# Patient Record
Sex: Male | Born: 1955 | Race: White | Hispanic: No | Smoking: Never smoker
Health system: Southern US, Community
[De-identification: ages and names within clinical notes are randomized; demographics above are authoritative.]

## PROBLEM LIST (undated history)

## (undated) DIAGNOSIS — R972 Elevated prostate specific antigen [PSA]: Secondary | ICD-10-CM

## (undated) DIAGNOSIS — Z87442 Personal history of urinary calculi: Secondary | ICD-10-CM

## (undated) DIAGNOSIS — I10 Essential (primary) hypertension: Secondary | ICD-10-CM

## (undated) DIAGNOSIS — R7303 Prediabetes: Secondary | ICD-10-CM

## (undated) DIAGNOSIS — N179 Acute kidney failure, unspecified: Secondary | ICD-10-CM

## (undated) HISTORY — DX: Elevated prostate specific antigen (PSA): R97.20

## (undated) HISTORY — DX: Prediabetes: R73.03

## (undated) HISTORY — DX: Acute kidney failure, unspecified: N17.9

## (undated) HISTORY — DX: Essential (primary) hypertension: I10

---

## 1997-01-25 DIAGNOSIS — Z9889 Other specified postprocedural states: Secondary | ICD-10-CM

## 1997-01-25 HISTORY — DX: Other specified postprocedural states: Z98.890

## 1999-05-25 ENCOUNTER — Encounter: Payer: Self-pay | Admitting: Emergency Medicine

## 1999-05-25 ENCOUNTER — Emergency Department (HOSPITAL_COMMUNITY): Admission: EM | Admit: 1999-05-25 | Discharge: 1999-05-25 | Payer: Self-pay | Admitting: Emergency Medicine

## 1999-07-03 ENCOUNTER — Encounter: Admission: RE | Admit: 1999-07-03 | Discharge: 1999-07-03 | Payer: Self-pay | Admitting: Urology

## 1999-07-03 ENCOUNTER — Encounter: Payer: Self-pay | Admitting: Urology

## 2005-04-05 ENCOUNTER — Ambulatory Visit (HOSPITAL_COMMUNITY): Admission: RE | Admit: 2005-04-05 | Discharge: 2005-04-05 | Payer: Self-pay | Admitting: Urology

## 2007-02-12 IMAGING — CT CT ABDOMEN W/ CM
2 of 6 series · 16 of 46 positions shown, 18 images · IV contrast (omnipaque)
Comparison: none

CLINICAL DATA: Bladder cancer; evaluate for metastatic disease.  
 ABDOMEN CT WITH CONTRAST:
TECHNIQUE: Multidetector CT imaging of the abdomen was performed following the standard protocol during bolus administration of intravenous contrast.
 Contrast:  125 cc Omnipaque 300.
TECHNIQUE: Multidetector CT imaging of the pelvis was performed following the standard protocol during bolus administration of intravenous contrast.

[Series 2: abd_pel 5.0 b40f st · axial · 0.71mm/px · z∈[-476,-46]mm · 13 of 98 slices shown, 15 images]
[im 6/98  soft-tissue]
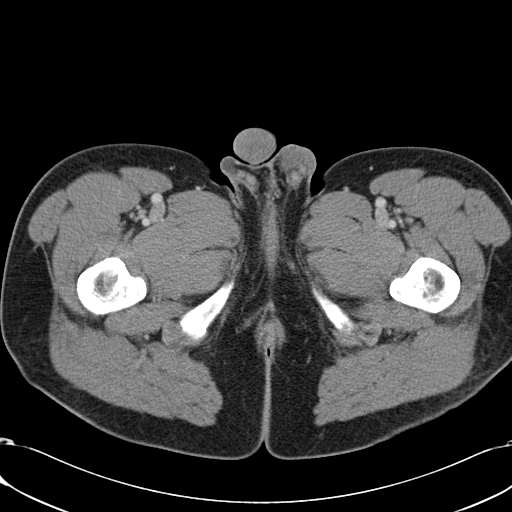
[im 6/98  bone]
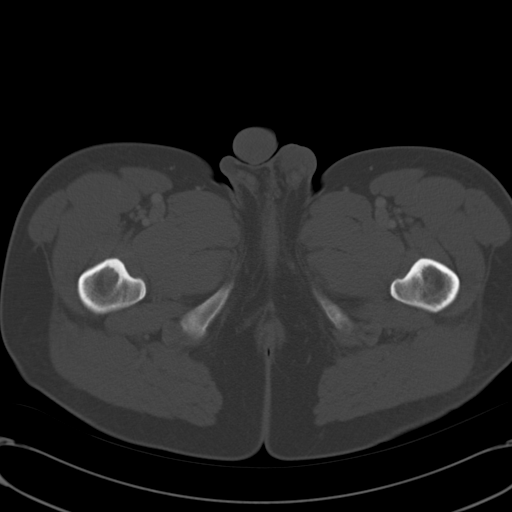
[im 12/98  soft-tissue]
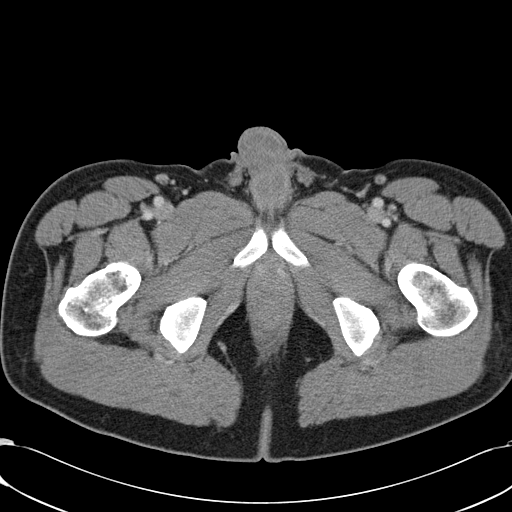
[im 23/98  soft-tissue]
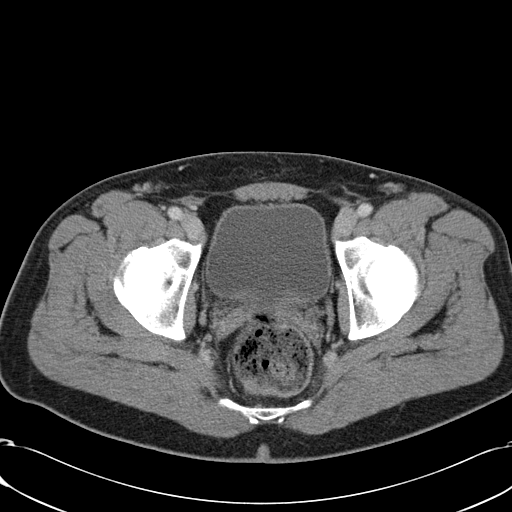
[im 29/98  soft-tissue]
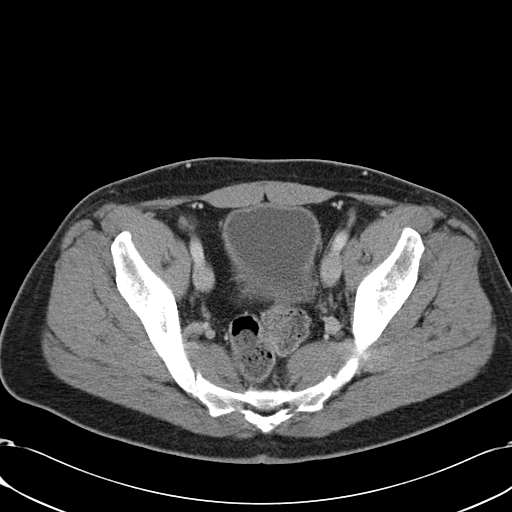
[im 35/98  soft-tissue]
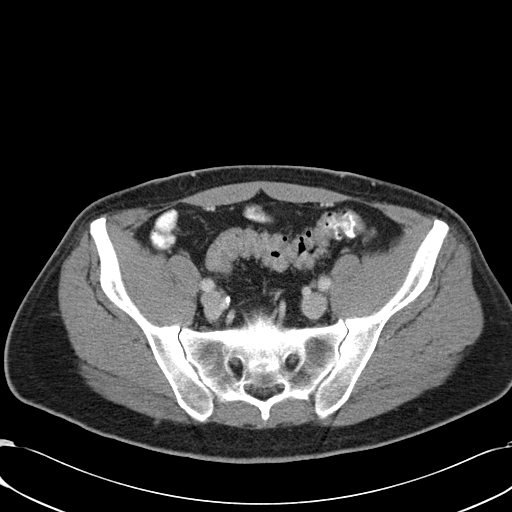
[im 40/98  soft-tissue]
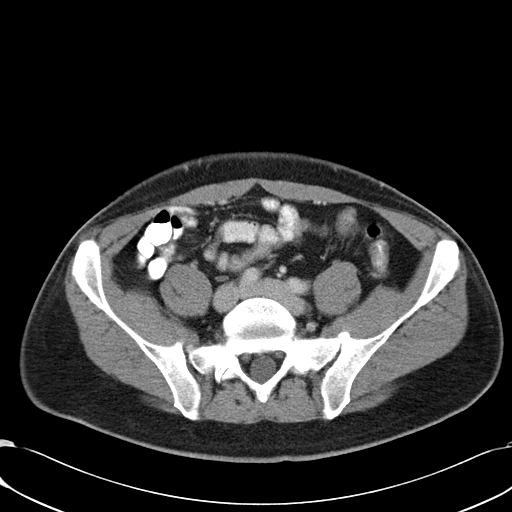
[im 52/98  soft-tissue]
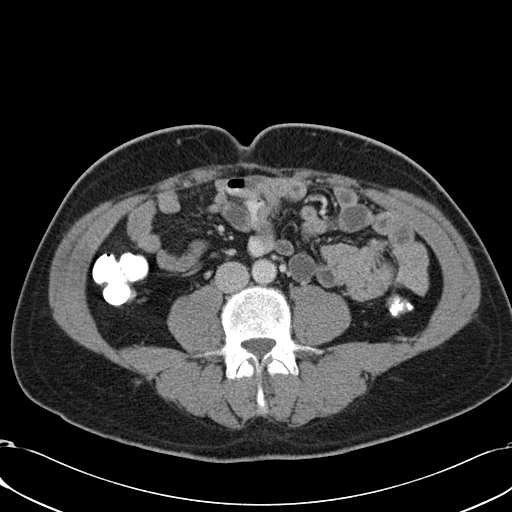
[im 58/98  soft-tissue]
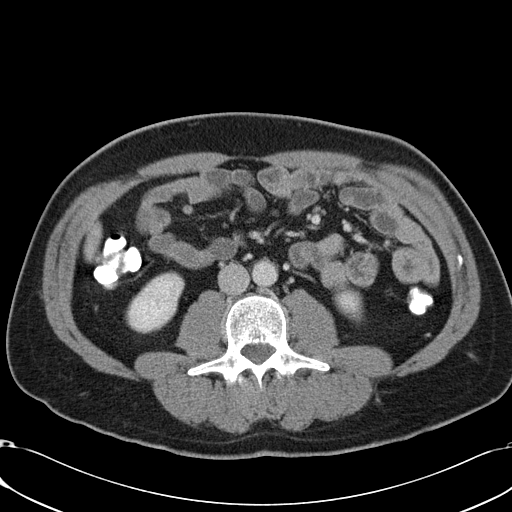
[im 63/98  soft-tissue]
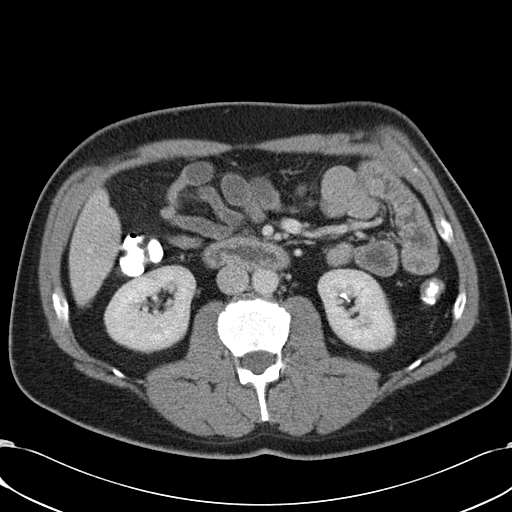
[im 63/98  bone]
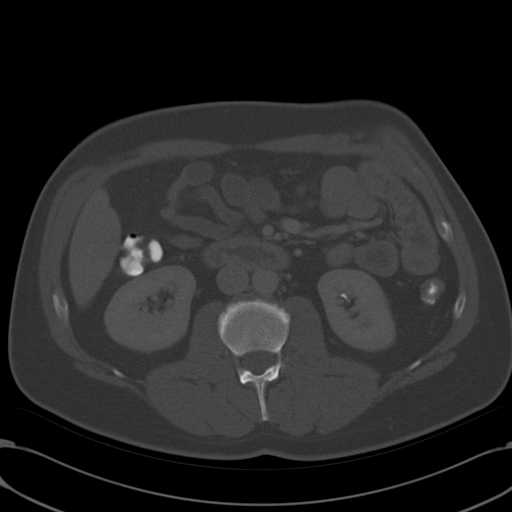
[im 69/98  soft-tissue]
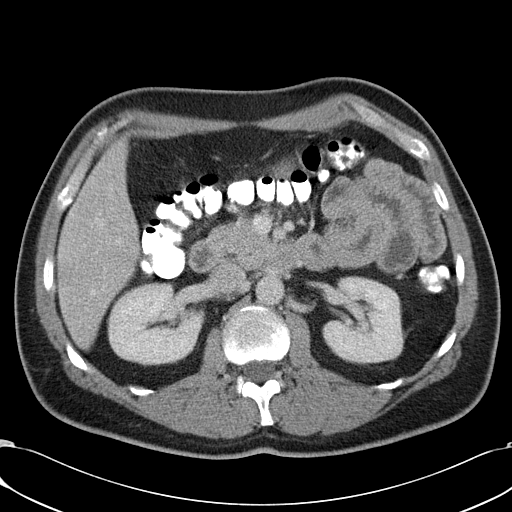
[im 75/98  soft-tissue]
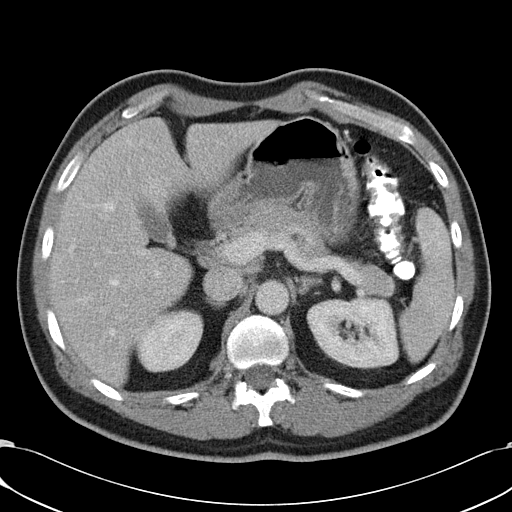
[im 86/98  soft-tissue]
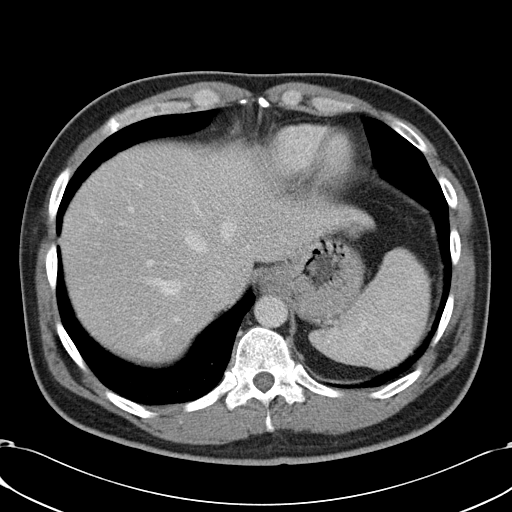
[im 92/98  soft-tissue]
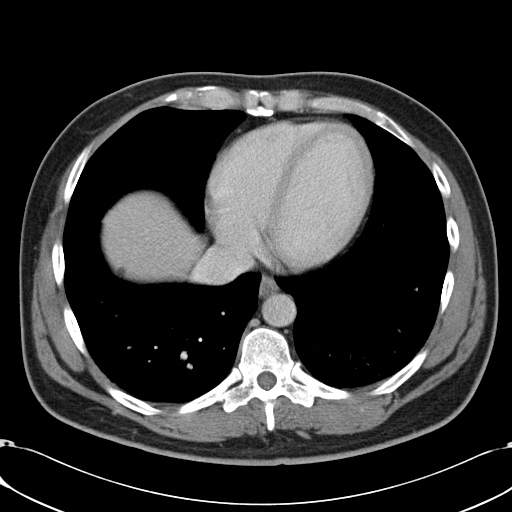

[Series 602: coronal · coronal · 0.99mm/px · 3 of 39 slices shown]
[im 13/39  soft-tissue]
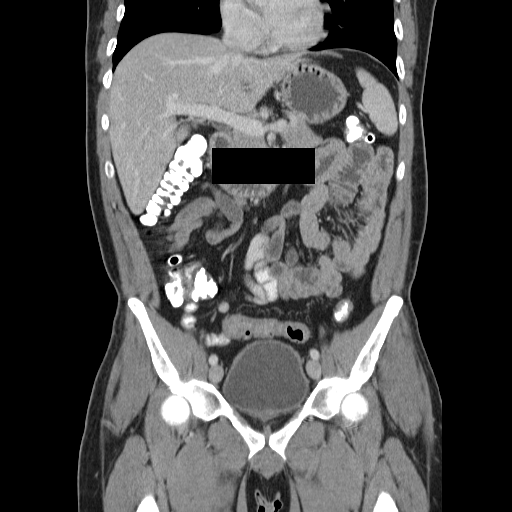
[im 17/39  soft-tissue]
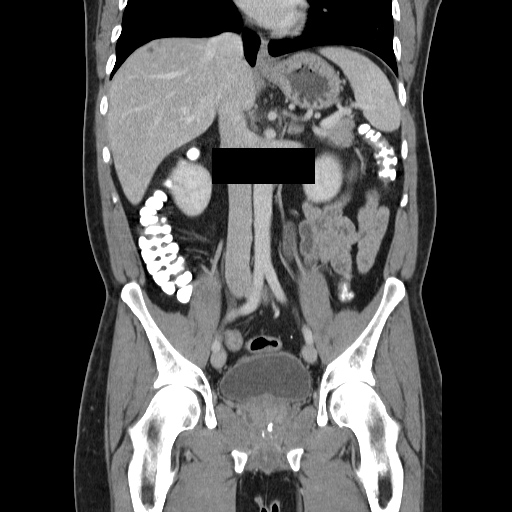
[im 22/39  soft-tissue]
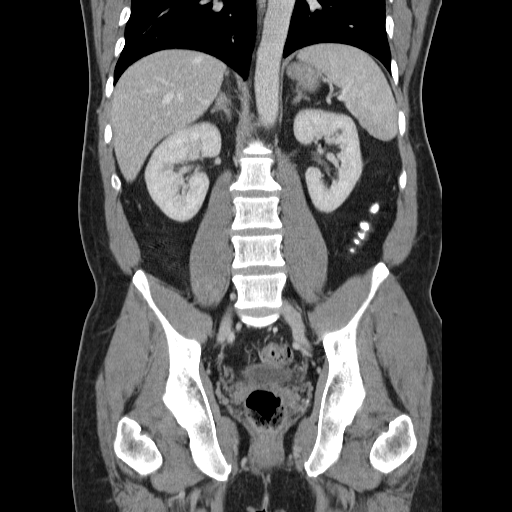

[16 of 46 positions shown; findings below may reference images not displayed]

FINDINGS: There are three less than 1 cm hypodensities within the right lobe of the liver (image 8 and 9).
 Vague focal area of low attenuation around the falciform ligament, likely represents focal fat.  The spleen is negative.  
 Adrenal glands are negative.
 Left kidney has several punctate calcific densities in this lower pole collecting system consistent with nephrolithiasis.  There is hydronephrosis.  The right kidney also has several punctate calcific densities within the lower pole collecting system consistent with nephrolithiasis but no hydronephrosis.  The appendix is negative.
 There is no pathologically enlarged retroperitoneal or mesenteric lymphadenopathy.
 Review of the bone windows is negative.
IMPRESSION: 1.  No definite evidence for metastatic disease.
 2.  Several low attenuation lesions within the right lobe of the liver likely represent simple cysts, but are too small to reliably characterize.  Attention on follow-up examination is recommended.
 3.  Vague area of low attenuation surrounding the falciform ligament, likely represents focal fat.
 PELVIS CT WITH CONTRAST:
FINDINGS: Diverticular changes affect the sigmoid colon.  There is no evidence for active diverticulitis.  
 There are no pathologically enlarged pelvic lymph nodes.  
 The pelvic bowel loops are unremarkable.  Urinary bladder is normal in appearance.  
 Review of the bone windows shows no lytic or sclerotic lesions.
IMPRESSION: No evidence for pelvic metastatic disease.

## 2021-01-25 HISTORY — PX: FOOT SURGERY: SHX648

## 2021-09-07 ENCOUNTER — Ambulatory Visit (INDEPENDENT_AMBULATORY_CARE_PROVIDER_SITE_OTHER): Payer: Self-pay | Admitting: Podiatry

## 2021-09-07 ENCOUNTER — Ambulatory Visit (INDEPENDENT_AMBULATORY_CARE_PROVIDER_SITE_OTHER): Payer: Self-pay

## 2021-09-07 ENCOUNTER — Encounter: Payer: Self-pay | Admitting: Podiatry

## 2021-09-07 ENCOUNTER — Other Ambulatory Visit: Payer: Self-pay | Admitting: Podiatry

## 2021-09-07 ENCOUNTER — Telehealth: Payer: Self-pay | Admitting: Podiatry

## 2021-09-07 DIAGNOSIS — L02612 Cutaneous abscess of left foot: Secondary | ICD-10-CM

## 2021-09-07 DIAGNOSIS — L03032 Cellulitis of left toe: Secondary | ICD-10-CM

## 2021-09-07 MED ORDER — SULFAMETHOXAZOLE-TRIMETHOPRIM 800-160 MG PO TABS: 1.0000 | ORAL_TABLET | Freq: Two times a day (BID) | ORAL | 0 refills | Status: AC

## 2021-09-07 MED ORDER — DOXYCYCLINE HYCLATE 100 MG PO TABS: 100.0000 mg | ORAL_TABLET | Freq: Two times a day (BID) | ORAL | 0 refills | Status: DC

## 2021-09-07 NOTE — Telephone Encounter (Signed)
Pts missed your call at the home number. He would like a call on his work number.  Please call [phone].

## 2021-09-07 NOTE — Telephone Encounter (Signed)
Pt calling with questions and concerns about medication:  doxycycline (VIBRA-TABS) 100 MG tablet  He is requesting to speak to Dr Ralene Cork about this if possible.  Please advise.

## 2021-09-07 NOTE — Progress Notes (Signed)
  Subjective:  Patient ID: Joel Kline, male    DOB: 01-12-1956,   MRN: [id number]  Chief Complaint  Patient presents with   Wound Check    66 y.o. male presents for concern of  left foot wound that started about a week ago. Relates he has dealt with a similar issue on the right side and had infection. Relates he was seen in urgent care and given antibiotics and told to follow-up Relates he was seen Thursday and antibiotics have not helped much it appears. Relates continued pain and has been dressing with mupirocin.  . Denies any other pedal complaints. Denies n/v/f/c.   No past medical history on file.  Objective:  Physical Exam: Vascular: DP/PT pulses 2/4 bilateral. CFT <3 seconds. Normal hair growth on digits. No edema.  Skin. No lacerations or abrasions bilateral feet. Dorsal fourth interspace wound about 0.3 cm with surrounding erythema and edema. Purulence expressed.  Musculoskeletal: MMT 5/5 bilateral lower extremities in DF, PF, Inversion and Eversion. Deceased ROM in DF of ankle joint.  Neurological: Sensation intact to light touch.   Assessment:   1. Cellulitis and abscess of toe of left foot      Plan:  Patient was evaluated and treated and all questions answered. Ulcer left dorsal foot with fat layer exposed  -Debridement as below. -Dressed with betadine, DSD. -Added on doxycycline x 10 days  and will await culture results from urgent care. Patient relates he will call with results.  -Discussed glucose control and proper protein-rich diet.  -Discussed if any worsening redness, pain, fever or chills to call or may need to report to the emergency room. Patient expressed understanding.   Procedure: Incision and drainage of left fot  Rationale: Removal of non-viable soft tissue from the wound to promote healing.  Anesthesia: none, patient refused anesthetic.  Pre-Debridement Wound Measurements: Scabbing overlying Post-Debridement Wound Measurements: 0.3 cm x 0.3 cm x  0.2 cm  Type of Debridement: Sharp Excisional Tissue Removed: Non-viable soft tissue Depth of Debridement: subcutaneous tissue. Purulence expressed as well and area irrigated with normal saline.  Technique: Sharp excisional debridement to bleeding, viable wound base.  Dressing: Dry, sterile, compression dressing. Disposition: Patient tolerated procedure well. Patient to return in 2 week for follow-up.  No follow-ups on file.   Louann Sjogren, DPM

## 2021-09-23 ENCOUNTER — Encounter: Payer: Self-pay | Admitting: Podiatry

## 2021-09-23 ENCOUNTER — Ambulatory Visit (INDEPENDENT_AMBULATORY_CARE_PROVIDER_SITE_OTHER): Payer: Self-pay | Admitting: Podiatry

## 2021-09-23 DIAGNOSIS — L02612 Cutaneous abscess of left foot: Secondary | ICD-10-CM

## 2021-09-23 DIAGNOSIS — L03032 Cellulitis of left toe: Secondary | ICD-10-CM

## 2021-09-23 MED ORDER — DOXYCYCLINE HYCLATE 100 MG PO TABS: 100.0000 mg | ORAL_TABLET | Freq: Two times a day (BID) | ORAL | 0 refills | Status: DC

## 2021-09-23 NOTE — Progress Notes (Addendum)
  Subjective:  Patient ID: Joel Kline, male    DOB: 01-Apr-1955,   MRN: [id number]  Chief Complaint  Patient presents with   Wound Check    2 week follow up. Patient states 6/10 pain when wearing shoes and walking.     66 y.o. male presents for follow-up of left foot cellulitis. Relate it is doing better but not as well as he hoped. Finishes anabiotics tomorrow. Relates PCP was able to get more pus out of the area. Has been dressing as instructed but relates painful in regular shoes. . Denies any other pedal complaints. Denies n/v/f/c.   History reviewed. No pertinent past medical history.  Objective:  Physical Exam: Vascular: DP/PT pulses 2/4 bilateral. CFT <3 seconds. Normal hair growth on digits. No edema.  Skin. No lacerations or abrasions bilateral feet. Dorsal fourth interspace wound healed there is some mild surrounding erythema and edema. Purulence expressed upon debridement.  Musculoskeletal: MMT 5/5 bilateral lower extremities in DF, PF, Inversion and Eversion. Deceased ROM in DF of ankle joint.  Neurological: Sensation intact to light touch.   Assessment:   1. Cellulitis and abscess of toe of left foot       Plan:  Patient was evaluated and treated and all questions answered. Cellulits and absess lef tfoot.  -Debridement as below. -Dressed with betadine, DSD. -Dispensed surgical shoe.  -Added on doxycycline x 10 days  and will await culture results from urgent care. Patient relates he will call with results.  -Discussed glucose control and proper protein-rich diet.  -Discussed if any worsening redness, pain, fever or chills to call or may need to report to the emergency room. Patient expressed understanding.   Procedure: Incision and drainage of left fot  Rationale: Removal of non-viable soft tissue from the wound to promote healing.  Anesthesia: none, patient refused anesthetic.  Pre-Debridement Wound Measurements: Scabbing overlying Post-Debridement Wound  Measurements: 0.3 cm x 0.3 cm x 0.2 cm  Type of Debridement: Sharp Excisional Tissue Removed: Non-viable soft tissue Depth of Debridement: subcutaneous tissue. Purulence expressed as well and area irrigated with normal saline.  Technique: Sharp excisional debridement to bleeding, viable wound base.  Dressing: Dry, sterile, compression dressing. Disposition: Patient tolerated procedure well. Patient to return in 2 week for follow-up.  Return in about 1 week (around 09/30/2021) for post op.   Louann Sjogren, DPM

## 2021-09-29 ENCOUNTER — Ambulatory Visit (INDEPENDENT_AMBULATORY_CARE_PROVIDER_SITE_OTHER): Payer: Self-pay | Admitting: Podiatry

## 2021-09-29 ENCOUNTER — Emergency Department (HOSPITAL_COMMUNITY): Payer: Self-pay

## 2021-09-29 ENCOUNTER — Inpatient Hospital Stay (HOSPITAL_COMMUNITY)
Admission: EM | Admit: 2021-09-29 | Discharge: 2021-10-01 | DRG: 603 | Disposition: A | Discharge status: Home / Self Care | Payer: Self-pay | Attending: Internal Medicine | Admitting: Internal Medicine

## 2021-09-29 ENCOUNTER — Encounter (HOSPITAL_COMMUNITY): Payer: Self-pay

## 2021-09-29 ENCOUNTER — Encounter: Payer: Self-pay | Admitting: Podiatry

## 2021-09-29 ENCOUNTER — Other Ambulatory Visit: Payer: Self-pay

## 2021-09-29 DIAGNOSIS — L02612 Cutaneous abscess of left foot: Secondary | ICD-10-CM

## 2021-09-29 DIAGNOSIS — L03116 Cellulitis of left lower limb: Principal | ICD-10-CM | POA: Diagnosis present

## 2021-09-29 DIAGNOSIS — L97529 Non-pressure chronic ulcer of other part of left foot with unspecified severity: Secondary | ICD-10-CM | POA: Diagnosis present

## 2021-09-29 DIAGNOSIS — K219 Gastro-esophageal reflux disease without esophagitis: Secondary | ICD-10-CM | POA: Diagnosis present

## 2021-09-29 DIAGNOSIS — Z7952 Long term (current) use of systemic steroids: Secondary | ICD-10-CM

## 2021-09-29 DIAGNOSIS — L03032 Cellulitis of left toe: Secondary | ICD-10-CM

## 2021-09-29 DIAGNOSIS — L089 Local infection of the skin and subcutaneous tissue, unspecified: Principal | ICD-10-CM

## 2021-09-29 HISTORY — DX: Personal history of urinary calculi: Z87.442

## 2021-09-29 HISTORY — DX: Cellulitis of left lower limb: L03.116

## 2021-09-29 LAB — BASIC METABOLIC PANEL
Anion gap: 7 (ref 5–15)
BUN: 13 mg/dL (ref 8–23)
CO2: 25 mmol/L (ref 22–32)
Calcium: 9.3 mg/dL (ref 8.9–10.3)
Chloride: 108 mmol/L (ref 98–111)
Creatinine, Ser: 0.91 mg/dL (ref 0.61–1.24)
GFR, Estimated: >60 mL/min (ref >60)
Glucose, Bld: 131 mg/dL — ABNORMAL HIGH (ref 70–99)
Potassium: 3.8 mmol/L (ref 3.5–5.1)
Sodium: 140 mmol/L (ref 135–145)

## 2021-09-29 LAB — CBC WITH DIFFERENTIAL/PLATELET
Abs Immature Granulocytes: 0.05 10*3/uL (ref 0.00–0.07)
Basophils Absolute: 0 10*3/uL (ref 0.0–0.1)
Basophils Relative: 0 %
Eosinophils Absolute: 0.3 10*3/uL (ref 0.0–0.5)
Eosinophils Relative: 3 %
HCT: 47.7 % (ref 39.0–52.0)
Hemoglobin: 15.6 g/dL (ref 13.0–17.0)
Immature Granulocytes: 1 %
Lymphocytes Relative: 28 %
Lymphs Abs: 2.8 10*3/uL (ref 0.7–4.0)
MCH: 31.8 pg (ref 26.0–34.0)
MCHC: 32.7 g/dL (ref 30.0–36.0)
MCV: 97.1 fL (ref 80.0–100.0)
Monocytes Absolute: 0.7 10*3/uL (ref 0.1–1.0)
Monocytes Relative: 7 %
Neutro Abs: 6.2 10*3/uL (ref 1.7–7.7)
Neutrophils Relative %: 61 %
Platelets: 205 10*3/uL (ref 150–400)
RBC: 4.91 MIL/uL (ref 4.22–5.81)
RDW: 13.3 % (ref 11.5–15.5)
WBC: 10.1 10*3/uL (ref 4.0–10.5)
nRBC: 0 % (ref 0.0–0.2)

## 2021-09-29 LAB — LACTIC ACID, PLASMA: Lactic Acid, Venous: 1.4 mmol/L (ref 0.5–1.9)

## 2021-09-29 LAB — C-REACTIVE PROTEIN: CRP: <0.5 mg/dL (ref <1.0)

## 2021-09-29 LAB — SEDIMENTATION RATE: Sed Rate: 7 mm/hr (ref 0–16)

## 2021-09-29 MED ORDER — GADOBUTROL 1 MMOL/ML IV SOLN
8.0000 mL | Freq: Once | INTRAVENOUS | Status: AC | PRN
  Administered 2021-09-29: 8 mL via INTRAVENOUS

## 2021-09-29 MED ORDER — HYDROXYZINE HCL 25 MG PO TABS
25.0000 mg | ORAL_TABLET | Freq: Three times a day (TID) | ORAL | Status: DC | PRN
  Administered 2021-09-29 – 2021-09-30 (×2): 25 mg via ORAL
  Filled 2021-09-29 (×2): qty 1

## 2021-09-29 MED ORDER — SODIUM CHLORIDE 0.9 % IV SOLN
2.0000 g | Freq: Three times a day (TID) | INTRAVENOUS | Status: DC
  Administered 2021-09-30 – 2021-10-01 (×4): 2 g via INTRAVENOUS
  Filled 2021-09-29 (×5): qty 12.5

## 2021-09-29 MED ORDER — SENNOSIDES-DOCUSATE SODIUM 8.6-50 MG PO TABS
1.0000 | ORAL_TABLET | Freq: Every evening | ORAL | Status: DC | PRN
Start: 2021-09-29 — End: 2021-10-01

## 2021-09-29 MED ORDER — VANCOMYCIN HCL 1250 MG/250ML IV SOLN
1250.0000 mg | Freq: Two times a day (BID) | INTRAVENOUS | Status: DC
  Administered 2021-09-30 (×2): 1250 mg via INTRAVENOUS
  Filled 2021-09-29 (×3): qty 250

## 2021-09-29 MED ORDER — ACETAMINOPHEN 650 MG RE SUPP: 650.0000 mg | Freq: Four times a day (QID) | RECTAL | Status: DC | PRN

## 2021-09-29 MED ORDER — VANCOMYCIN HCL 1750 MG/350ML IV SOLN
1750.0000 mg | INTRAVENOUS | Status: AC
  Administered 2021-09-29: 1750 mg via INTRAVENOUS
  Filled 2021-09-29 (×2): qty 350

## 2021-09-29 MED ORDER — OXYCODONE HCL 5 MG PO TABS
5.0000 mg | ORAL_TABLET | ORAL | Status: DC | PRN
  Administered 2021-09-30 – 2021-10-01 (×5): 5 mg via ORAL
  Filled 2021-09-29 (×5): qty 1

## 2021-09-29 MED ORDER — ACETAMINOPHEN 325 MG PO TABS: 650.0000 mg | ORAL_TABLET | Freq: Four times a day (QID) | ORAL | Status: DC | PRN

## 2021-09-29 MED ORDER — LACTATED RINGERS IV SOLN: INTRAVENOUS | Status: AC

## 2021-09-29 MED ORDER — SODIUM CHLORIDE 0.9 % IV SOLN
2.0000 g | INTRAVENOUS | Status: AC
  Administered 2021-09-29: 2 g via INTRAVENOUS
  Filled 2021-09-29: qty 12.5

## 2021-09-29 NOTE — Progress Notes (Signed)
A consult was received from an ED physician for Vancomycin and Cefepime per pharmacy dosing.  The patient's profile has been reviewed for ht/wt/allergies/indication/available labs.    A one time order has been placed for Vancomycin 1750mg  IV and Cefepime 2g IV.  Further antibiotics/pharmacy consults should be ordered by admitting physician if indicated.                       Thank you, 09/29/2021  8:52 PM

## 2021-09-29 NOTE — ED Notes (Signed)
Pt states he is cold. Warm blankets given.

## 2021-09-29 NOTE — ED Provider Notes (Signed)
Joel Kline Provider Note  CSN: [id number] Arrival date & time: 09/29/21 1042  Chief Complaint(s) Wound Infection  HPI Joel Kline is a 66 y.o. male is a 66 y.o. male who presents emergency department for evaluation of left foot pain.  Patient has been dealing with a chronic wound over the MTP joint of the left fifth toe for many months now and has taken multiple rounds of antibiotics and is currently on doxycycline but feels that this is not helping him.  He states that they have been able to extract pus from the wound and have cultured this past but this has never grown any organisms.  He has no history of diabetes or known vascular insufficiency.  No smoking history.  Patient saw his podiatrist today who sent him to the emergency department for an MRI for an osteomyelitis rule out and possible IV antibiotics.  Denies any additional systemic symptoms including chest pain, shortness of breath, nausea, vomiting, fever, chills or other systemic symptoms.   Past Medical History History reviewed. No pertinent past medical history. There are no problems to display for this patient.  Home Medication(s) Prior to Admission medications   Medication Sig Start Date End Date Taking? Authorizing Provider  cefdinir (OMNICEF) 300 MG capsule Take 300 mg by mouth 2 (two) times daily. 07/19/21   [provider]  cephALEXin (KEFLEX) 500 MG capsule Take 500 mg by mouth 3 (three) times daily. 05/17/21   [provider]  doxycycline (VIBRA-TABS) 100 MG tablet Take 1 tablet (100 mg total) by mouth 2 (two) times daily for 10 days. 09/23/21 10/03/21  Louann Sjogren, DPM  mupirocin ointment (BACTROBAN) 2 % Apply topically 2 (two) times daily. 05/17/21   [provider]  predniSONE (DELTASONE) 20 MG tablet Take by mouth. 07/19/21   [provider]                                                                                                                                     Past Surgical History History reviewed. No pertinent surgical history. Family History History reviewed. No pertinent family history.  Social History   Allergies Patient has no known allergies.  Review of Systems Review of Systems  Skin:  Positive for wound.    Physical Exam Vital Signs  I have reviewed the triage vital signs BP 134/81   Pulse (!) 53   Temp 98.4 F (36.9 C) (Oral)   Resp 18   SpO2 97%   Physical Exam Constitutional:      General: He is not in acute distress.    Appearance: Normal appearance.  HENT:     Head: Normocephalic and atraumatic.     Nose: No congestion or rhinorrhea.  Eyes:     General:        Right eye: No discharge.        Left eye: No discharge.  Extraocular Movements: Extraocular movements intact.     Pupils: Pupils are equal, round, and reactive to light.  Cardiovascular:     Rate and Rhythm: Normal rate and regular rhythm.     Heart sounds: No murmur heard. Pulmonary:     Effort: No respiratory distress.     Breath sounds: No wheezing or rales.  Abdominal:     General: There is no distension.     Tenderness: There is no abdominal tenderness.  Musculoskeletal:        General: Normal range of motion.     Cervical back: Normal range of motion.  Skin:    General: Skin is warm and dry.     Findings: Lesion present.  Neurological:     General: No focal deficit present.     Mental Status: He is alert.     ED Results and Treatments Labs (all labs ordered are listed, but only abnormal results are displayed) Labs Reviewed  BASIC METABOLIC PANEL - Abnormal; Notable for the following components:      Result Value   Glucose, Bld 131 (*)    All other components within normal limits  CBC WITH DIFFERENTIAL/PLATELET  LACTIC ACID, PLASMA  LACTIC ACID, PLASMA                                                                                                                          Radiology DG Foot Complete  Left  Result Date: 09/29/2021 CLINICAL DATA:  Foot wound. EXAM: LEFT FOOT - COMPLETE 3+ VIEW COMPARISON:  Radiograph September 07, 2021. FINDINGS: There is no evidence of fracture or dislocation. No cortical erosion or periosteal reaction identified. Pes planus. Calcaneal enthesophyte. Dorsal midfoot degenerative change. IMPRESSION: No evidence of osteomyelitis or acute fracture. Please note if continued clinical concern for osteomyelitis MRI is a more sensitive imaging modality. Electronically Signed   By: Maudry Mayhew M.D.   On: 09/29/2021 11:52    Pertinent labs & imaging results that were available during my care of the patient were reviewed by me and considered in my medical decision making (see MDM for details).  Medications Ordered in ED Medications - No data to display                                                                                                                                   Procedures Procedures  (including critical care time)  Medical Decision Making / ED Course   This patient presents to the ED for concern of foot wound, this involves an extensive number of treatment options, and is a complaint that carries with it a high risk of complications and morbidity.  The differential diagnosis includes cellulitis, abscess, retained foreign body, insufficiency ulcer  MDM: Patient seen emergency department for evaluation of left foot ulcer.  Physical exam with a small ulcer over the dorsal aspect of the left MTP joint of the fifth digit.  Laboratory evaluation unremarkable.  Initial x-ray of the foot without evidence of osteomyelitis.  As the patient was sent to the emergency department by a podiatrist for MRI imaging to rule out osteomyelitis an MRI was obtained that shows a small fluid collection, abscess versus hematoma.  I spoke with Dr. Ardelle Anton of podiatry who recommends initiation of IV antibiotics and admission for I&D tomorrow.  Patient then admitted.  Patient would  benefit from vascular consultation for routine ABIs while inpatient.   Additional history obtained: -Additional history obtained from wife -External records from outside source obtained and reviewed including: Chart review including previous notes, labs, imaging, consultation notes   Lab Tests: -I ordered, reviewed, and interpreted labs.   The pertinent results include:   Labs Reviewed  BASIC METABOLIC PANEL - Abnormal; Notable for the following components:      Result Value   Glucose, Bld 131 (*)    All other components within normal limits  CBC WITH DIFFERENTIAL/PLATELET  LACTIC ACID, PLASMA  LACTIC ACID, PLASMA     Imaging Studies ordered: I ordered imaging studies including XR foot, MRI foot2 I independently visualized and interpreted imaging. I agree with the radiologist interpretation   Medicines ordered and prescription drug management: No orders of the defined types were placed in this encounter.   -I have reviewed the patients home medicines and have made adjustments as needed  Critical interventions none  Consultations Obtained: I requested consultation with the podiatrist Dr. Ardelle Anton,  and discussed lab and imaging findings as well as pertinent plan - they recommend: IV antibiotics and admission   Cardiac Monitoring: The patient was maintained on a cardiac monitor.  I personally viewed and interpreted the cardiac monitored which showed an underlying rhythm of: NSR  Social Determinants of Health:  Factors impacting patients care include: Issues with health insurance   Reevaluation: After the interventions noted above, I reevaluated the patient and found that they have :improved  Co morbidities that complicate the patient evaluation History reviewed. No pertinent past medical history.    Dispostion: I considered admission for this patient, and due to need for MRI with suspected cellulitis failing outpatient antibiotics, patient will require hospital  admission     Final Clinical Impression(s) / ED Diagnoses Final diagnoses:  None     @PCDICTATION @    , MD 09/30/21 1155

## 2021-09-29 NOTE — Progress Notes (Addendum)
Pharmacy Antibiotic Note  Joel Kline is a 66 y.o. male admitted on 09/29/2021 with left foot cellulitis/abscess. Pharmacy has been consulted for Vancomycin and Cefepime dosing.  Plan: Vancomycin 1750mg  IV x 1, then 1250mg  IV q12h to keep AUC 400-550 Vancomycin levels at steady state, as indicated Cefepime 2g IV q8h Monitor renal function, cultures as available, clinical course  Height: 6' (182.9 cm) Weight: 83.9 kg (185 lb) IBW/kg (Calculated) : 77.6  Temp (24hrs), Avg:98.2 F (36.8 C), Min:97.9 F (36.6 C), Max:98.5 F (36.9 C)  Recent Labs  Lab 09/29/21 1133  WBC 10.1  CREATININE 0.91  LATICACIDVEN 1.4    Estimated Creatinine Clearance: 91.2 mL/min (by C-G formula based on SCr of 0.91 mg/dL).    No Known Allergies  Antimicrobials this admission: 9/5 Vancomycin >> 9/5 Cefepime >>  Dose adjustments this admission: --  Microbiology results: None ordered at this time  Thank you for allowing pharmacy to be a part of this patient's care.   11/5, PharmD, BCPS Clinical Pharmacist  09/29/2021 9:05 PM

## 2021-09-29 NOTE — ED Notes (Signed)
Pt refused vital reassessment. Pt stated " I do not need or want my vitals again. I came here for an MRI"

## 2021-09-29 NOTE — ED Triage Notes (Signed)
Pt reports wound to L foot for a few months. Pt reports previously going to UC and podiatrist and was prescribed multiple antibiotics without relief. Pt reports being sent here by podiatrist to get MRI and possibly IV antibiotics. Pt endorses pain to foot.

## 2021-09-29 NOTE — ED Provider Triage Note (Signed)
Emergency Medicine Provider Triage Evaluation Note  MUTASIM TUCKEY , a 66 y.o. male  was evaluated in triage.  Pt complains of left foot pain, not diabetic. Seen initially with UC in [place], followed now with Dr. Wyline Copas with [facility] who suggested ER. Wound cx have been negative. Onset back on April, waxes and wanes. Similar on right foot but never this bad.   Review of Systems  Positive: Foot pain Negative: fever  Physical Exam  BP (!) 153/90 (BP Location: Left Arm)   Pulse (!) 58   Temp 97.9 F (36.6 C) (Oral)   Resp 20   SpO2 99%  Gen:   Awake, no distress   Resp:  Normal effort  MSK:   Moves extremities without difficulty  Other:    Medical Decision Making  Medically screening exam initiated at 11:16 AM.  Appropriate orders placed.  Midge Minium was informed that the remainder of the evaluation will be completed by another provider, this initial triage assessment does not replace that evaluation, and the importance of remaining in the ED until their evaluation is complete.     Jeannie Fend, PA-C 09/29/21 1117

## 2021-09-29 NOTE — H&P (Addendum)
History and Physical    CANNON ARREOLA DPO:[id number] DOB: 1955-05-09 DOA: 09/29/2021  PCP: Pcp, No   Patient coming from: Home   Chief Complaint: left foot infection   HPI: Joel Kline is a 66 y.o. male who denies any significant past medical history now presents to the emergency department at the direction of his podiatrist for evaluation of a left foot infection.  Patient reports that he had a small abrasion over the lateral aspect of his left foot roughly 5 months ago that developed surrounding erythema and swelling.  He was placed on antibiotics from an urgent care but continued to experience worsening and eventually purulent drainage.  He was seen by podiatry on 09/07/2021, had incision and drainage performed in the clinic, and was given 10 days of doxycycline.  He was seen back in the podiatry clinic 2 weeks later with continued purulent drainage, underwent another I&D, and was given 10 more days of doxycycline.  He was seen by podiatry in follow-up today and directed to the ED for IV antibiotics and MRI.  ED Course: Upon arrival to the ED, patient is found to be afebrile and saturating well on room air with stable blood pressure.  Plain radiographs of the left foot were negative for osteomyelitis or acute fracture.  MRI of the left foot (preliminary read) reveals 1 to 2 cm abscess adjacent to the fifth MTP.  Podiatry (Dr. Ardelle Anton) was consulted by the ED physician and the patient was started on IV antibiotics.  Review of Systems:  All other systems reviewed and apart from HPI, are negative.  History reviewed. No pertinent past medical history.  History reviewed. No pertinent surgical history.  Social History:   has no history on file for tobacco use, alcohol use, and drug use.  No Known Allergies  History reviewed. No pertinent family history.   Prior to Admission medications   Medication Sig Start Date End Date Taking? Authorizing Provider  cefdinir (OMNICEF) 300 MG capsule  Take 300 mg by mouth 2 (two) times daily. 07/19/21   [provider]  cephALEXin (KEFLEX) 500 MG capsule Take 500 mg by mouth 3 (three) times daily. 05/17/21   [provider]  doxycycline (VIBRA-TABS) 100 MG tablet Take 1 tablet (100 mg total) by mouth 2 (two) times daily for 10 days. 09/23/21 10/03/21  Louann Sjogren, DPM  mupirocin ointment (BACTROBAN) 2 % Apply topically 2 (two) times daily. 05/17/21   [provider]  predniSONE (DELTASONE) 20 MG tablet Take by mouth. 07/19/21   [provider]    Physical Exam: Vitals:   09/29/21 1921 09/29/21 2015 09/29/21 2022 09/29/21 2037  BP: (!) 127/91 (!) 148/93    Pulse: (!) 53 (!) 58    Resp: 17 16    Temp:   98 F (36.7 C)   TempSrc:   Oral   SpO2: 99% 94%    Weight:    83.9 kg  Height:    6' (1.829 m)    Constitutional: NAD, no diaphoresis or pallor Eyes: PERTLA, lids and conjunctivae normal ENMT: Mucous membranes are moist. Posterior pharynx clear of any exudate or lesions.   Neck: supple, no masses  Respiratory:  no wheezing, no crackles. No accessory muscle use.  Cardiovascular: S1 & S2 heard, regular rate and rhythm. No extremity edema.   Abdomen: No distension, no tenderness, soft. Bowel sounds active.  Musculoskeletal: no clubbing / cyanosis. No joint deformity upper and lower extremities.   Skin: Crusted lesion on dorsal  left foot near 5th MTP with surrounding erythema and heat. Warm, dry, well-perfused. Neurologic: CN 2-12 grossly intact. Moving all extremities. Alert and oriented.  Psychiatric: Calm. Cooperative.    Labs and Imaging on Admission: I have personally reviewed following labs and imaging studies  CBC: Recent Labs  Lab 09/29/21 1133  WBC 10.1  NEUTROABS 6.2  HGB 15.6  HCT 47.7  MCV 97.1  PLT 205   Basic Metabolic Panel: Recent Labs  Lab 09/29/21 1133  NA 140  K 3.8  CL 108  CO2 25  GLUCOSE 131*  BUN 13  CREATININE 0.91  CALCIUM 9.3   GFR: Estimated  Creatinine Clearance: 91.2 mL/min (by C-G formula based on SCr of 0.91 mg/dL). Liver Function Tests: No results for input(s): "AST", "ALT", "ALKPHOS", "BILITOT", "PROT", "ALBUMIN" in the last 168 hours. No results for input(s): "LIPASE", "AMYLASE" in the last 168 hours. No results for input(s): "AMMONIA" in the last 168 hours. Coagulation Profile: No results for input(s): "INR", "PROTIME" in the last 168 hours. Cardiac Enzymes: No results for input(s): "CKTOTAL", "CKMB", "CKMBINDEX", "TROPONINI" in the last 168 hours. BNP (last 3 results) No results for input(s): "PROBNP" in the last 8760 hours. HbA1C: No results for input(s): "HGBA1C" in the last 72 hours. CBG: No results for input(s): "GLUCAP" in the last 168 hours. Lipid Profile: No results for input(s): "CHOL", "HDL", "LDLCALC", "TRIG", "CHOLHDL", "LDLDIRECT" in the last 72 hours. Thyroid Function Tests: No results for input(s): "TSH", "T4TOTAL", "FREET4", "T3FREE", "THYROIDAB" in the last 72 hours. Anemia Panel: No results for input(s): "VITAMINB12", "FOLATE", "FERRITIN", "TIBC", "IRON", "RETICCTPCT" in the last 72 hours. Urine analysis: No results found for: "COLORURINE", "APPEARANCEUR", "LABSPEC", "PHURINE", "GLUCOSEU", "HGBUR", "BILIRUBINUR", "KETONESUR", "PROTEINUR", "UROBILINOGEN", "NITRITE", "LEUKOCYTESUR" Sepsis Labs: @LABRCNTIP (procalcitonin:4,lacticidven:4) )No results found for this or any previous visit (from the past 240 hour(s)).   Radiological Exams on Admission: DG Foot Complete Left  Result Date: 09/29/2021 CLINICAL DATA:  Foot wound. EXAM: LEFT FOOT - COMPLETE 3+ VIEW COMPARISON:  Radiograph September 07, 2021. FINDINGS: There is no evidence of fracture or dislocation. No cortical erosion or periosteal reaction identified. Pes planus. Calcaneal enthesophyte. Dorsal midfoot degenerative change. IMPRESSION: No evidence of osteomyelitis or acute fracture. Please note if continued clinical concern for osteomyelitis MRI is  a more sensitive imaging modality. Electronically Signed   By: September 09, 2021 M.D.   On: 09/29/2021 11:52     Assessment/Plan  1. Cellulitis and abscess of left foot  - Presents with persistent cellulitis of left foot despite multiple courses of outpatient antibiotics and debridement on September 23, 2021  - He is not septic on admission   - MRI (preliminary read) reveals 1-2 cm abscess adjacent to 5th MTP  - Podiatry consulted by ED and antibiotics started  - Continue IV antibiotics, follow-up podiatry recommendations     DVT prophylaxis: SCDs  Code Status: Full  Level of Care:  Level of care: Med-Surg Family Communication: none present  Disposition Plan:  Patient is from: home  Anticipated d/c is to: home  Anticipated d/c date is: Possibly as early as 9/6 or 10/01/21  Patient currently: Pending podiatry consult and likely I&D  Consults called: podiatry  Admission status: Observation     12/01/21, MD [facility]  09/29/2021, 9:06 PM

## 2021-09-29 NOTE — ED Notes (Signed)
Received call that MRI was ready to be read . Informed provider and called radiology

## 2021-09-29 NOTE — Progress Notes (Signed)
  Subjective:  Patient ID: Joel Kline, male    DOB: [birth year],   MRN: [id number]  Chief Complaint  Patient presents with   Wound Check    Patient states that doxycycline has not helped at all. Foot hurts when he stands on it. Elevating makes foot feels better.    66 y.o. male presents for follow-up of left foot cellulitis. Relates still doing the same. Relates antibiotics are not helping. Still taking antibiotics. Has been dressing as instructed but relates painful in regular shoes. States hurts with walking and elevation helps with pain. States he is concerned that he has continued to deal with this with no improvement. Patient without insurance and worried about that as well.  . Denies any other pedal complaints. Denies n/v/f/c.   No past medical history on file.  Objective:  Physical Exam: Vascular: DP/PT pulses 2/4 bilateral. CFT <3 seconds. Normal hair growth on digits. No edema.  Skin. No lacerations or abrasions bilateral feet. Dorsal fourth interspace wound healed there is some mild surrounding erythema and edema. Not much improvement noted from previous appointment.  Musculoskeletal: MMT 5/5 bilateral lower extremities in DF, PF, Inversion and Eversion. Deceased ROM in DF of ankle joint.  Neurological: Sensation intact to light touch.   Assessment:   1. Cellulitis and abscess of toe of left foot        Plan:  Patient was evaluated and treated and all questions answered. Cellulits and absess left foot.  -Discussed continued pain in this foot and no improvement on oral antibiotics. Relates to patient that at this point the best option for him is to go to the ED for IV antibiotics and further work-up with MRI and possible surgical intervention and wash-out. He did express understanding and will be planning on going today.  -Dressed with betadine, DSD. -Continue surgical shoe.  -Discussed glucose control and proper protein-rich diet.  -Patient will be going to ED for  further work-up after failed outpatient antibiotics.       No follow-ups on file.   Louann Sjogren, DPM

## 2021-09-30 ENCOUNTER — Inpatient Hospital Stay (HOSPITAL_COMMUNITY): Payer: Self-pay | Admitting: Anesthesiology

## 2021-09-30 ENCOUNTER — Encounter (HOSPITAL_COMMUNITY): Payer: Self-pay | Admitting: Family Medicine

## 2021-09-30 ENCOUNTER — Inpatient Hospital Stay (HOSPITAL_COMMUNITY): Payer: Self-pay

## 2021-09-30 ENCOUNTER — Other Ambulatory Visit: Payer: Self-pay

## 2021-09-30 ENCOUNTER — Encounter (HOSPITAL_COMMUNITY)
Admission: EM | Disposition: A | Discharge status: Home / Self Care | Payer: Self-pay | Source: Home / Self Care | Attending: Internal Medicine

## 2021-09-30 DIAGNOSIS — L03116 Cellulitis of left lower limb: Secondary | ICD-10-CM

## 2021-09-30 DIAGNOSIS — L02612 Cutaneous abscess of left foot: Secondary | ICD-10-CM

## 2021-09-30 HISTORY — PX: INCISION AND DRAINAGE: SHX5863

## 2021-09-30 LAB — BASIC METABOLIC PANEL
Anion gap: 6 (ref 5–15)
BUN: 14 mg/dL (ref 8–23)
CO2: 27 mmol/L (ref 22–32)
Calcium: 8.8 mg/dL — ABNORMAL LOW (ref 8.9–10.3)
Chloride: 109 mmol/L (ref 98–111)
Creatinine, Ser: 0.72 mg/dL (ref 0.61–1.24)
GFR, Estimated: >60 mL/min (ref >60)
Glucose, Bld: 85 mg/dL (ref 70–99)
Potassium: 4 mmol/L (ref 3.5–5.1)
Sodium: 142 mmol/L (ref 135–145)

## 2021-09-30 LAB — CBC
HCT: 43.5 % (ref 39.0–52.0)
Hemoglobin: 14 g/dL (ref 13.0–17.0)
MCH: 31.7 pg (ref 26.0–34.0)
MCHC: 32.2 g/dL (ref 30.0–36.0)
MCV: 98.4 fL (ref 80.0–100.0)
Platelets: 218 10*3/uL (ref 150–400)
RBC: 4.42 MIL/uL (ref 4.22–5.81)
RDW: 13.5 % (ref 11.5–15.5)
WBC: 9.7 10*3/uL (ref 4.0–10.5)
nRBC: 0 % (ref 0.0–0.2)

## 2021-09-30 LAB — SURGICAL PCR SCREEN
MRSA, PCR: NEGATIVE
Staphylococcus aureus: NEGATIVE

## 2021-09-30 LAB — HIV ANTIBODY (ROUTINE TESTING W REFLEX): HIV Screen 4th Generation wRfx: NONREACTIVE

## 2021-09-30 SURGERY — INCISION AND DRAINAGE
Anesthesia: General | Laterality: Left

## 2021-09-30 MED ORDER — ACETAMINOPHEN 500 MG PO TABS
ORAL_TABLET | ORAL | Status: AC
  Filled 2021-09-30: qty 2

## 2021-09-30 MED ORDER — CHLORHEXIDINE GLUCONATE 0.12 % MT SOLN
15.0000 mL | OROMUCOSAL | Status: AC
Start: 2021-09-30 — End: 2021-09-30
  Administered 2021-09-30: 15 mL via OROMUCOSAL

## 2021-09-30 MED ORDER — PROPOFOL 10 MG/ML IV BOLUS
INTRAVENOUS | Status: AC
  Filled 2021-09-30: qty 20

## 2021-09-30 MED ORDER — PROMETHAZINE HCL 25 MG/ML IJ SOLN: 6.2500 mg | INTRAMUSCULAR | Status: DC | PRN

## 2021-09-30 MED ORDER — LIDOCAINE HCL (PF) 2 % IJ SOLN
INTRAMUSCULAR | Status: AC
  Filled 2021-09-30: qty 5

## 2021-09-30 MED ORDER — DEXAMETHASONE SODIUM PHOSPHATE 10 MG/ML IJ SOLN
INTRAMUSCULAR | Status: AC
  Filled 2021-09-30: qty 1

## 2021-09-30 MED ORDER — BUPIVACAINE HCL (PF) 0.5 % IJ SOLN
INTRAMUSCULAR | Status: DC | PRN
  Administered 2021-09-30: 10 mL

## 2021-09-30 MED ORDER — 0.9 % SODIUM CHLORIDE (POUR BTL) OPTIME
TOPICAL | Status: DC | PRN
  Administered 2021-09-30: 1000 mL

## 2021-09-30 MED ORDER — DEXAMETHASONE SODIUM PHOSPHATE 10 MG/ML IJ SOLN
INTRAMUSCULAR | Status: DC | PRN
  Administered 2021-09-30: 10 mg via INTRAVENOUS

## 2021-09-30 MED ORDER — MIDAZOLAM HCL 2 MG/2ML IJ SOLN
INTRAMUSCULAR | Status: DC | PRN
  Administered 2021-09-30: 2 mg via INTRAVENOUS

## 2021-09-30 MED ORDER — PHENYLEPHRINE 80 MCG/ML (10ML) SYRINGE FOR IV PUSH (FOR BLOOD PRESSURE SUPPORT)
PREFILLED_SYRINGE | INTRAVENOUS | Status: DC | PRN
  Administered 2021-09-30: 80 ug via INTRAVENOUS
  Administered 2021-09-30 (×2): 160 ug via INTRAVENOUS

## 2021-09-30 MED ORDER — LACTATED RINGERS IV SOLN: INTRAVENOUS | Status: DC

## 2021-09-30 MED ORDER — CHLORHEXIDINE GLUCONATE CLOTH 2 % EX PADS: 6.0000 | MEDICATED_PAD | Freq: Once | CUTANEOUS | Status: DC

## 2021-09-30 MED ORDER — PROPOFOL 10 MG/ML IV BOLUS
INTRAVENOUS | Status: DC | PRN
  Administered 2021-09-30: 100 mg via INTRAVENOUS
  Administered 2021-09-30: 50 mg via INTRAVENOUS

## 2021-09-30 MED ORDER — EPHEDRINE SULFATE-NACL 50-0.9 MG/10ML-% IV SOSY
PREFILLED_SYRINGE | INTRAVENOUS | Status: DC | PRN
  Administered 2021-09-30: 5 mg via INTRAVENOUS
  Administered 2021-09-30 (×2): 10 mg via INTRAVENOUS

## 2021-09-30 MED ORDER — ONDANSETRON HCL 4 MG/2ML IJ SOLN
INTRAMUSCULAR | Status: DC | PRN
  Administered 2021-09-30: 4 mg via INTRAVENOUS

## 2021-09-30 MED ORDER — ACETAMINOPHEN 500 MG PO TABS
1000.0000 mg | ORAL_TABLET | Freq: Once | ORAL | Status: AC
  Administered 2021-09-30: 1000 mg via ORAL

## 2021-09-30 MED ORDER — FENTANYL CITRATE (PF) 100 MCG/2ML IJ SOLN
INTRAMUSCULAR | Status: AC
  Filled 2021-09-30: qty 2

## 2021-09-30 MED ORDER — BUPIVACAINE HCL (PF) 0.5 % IJ SOLN
INTRAMUSCULAR | Status: AC
  Filled 2021-09-30: qty 30

## 2021-09-30 MED ORDER — FENTANYL CITRATE PF 50 MCG/ML IJ SOSY: 25.0000 ug | PREFILLED_SYRINGE | INTRAMUSCULAR | Status: DC | PRN

## 2021-09-30 MED ORDER — FENTANYL CITRATE (PF) 100 MCG/2ML IJ SOLN
INTRAMUSCULAR | Status: DC | PRN
Start: 2021-09-30 — End: 2021-09-30
  Administered 2021-09-30: 100 ug via INTRAVENOUS

## 2021-09-30 MED ORDER — MIDAZOLAM HCL 2 MG/2ML IJ SOLN
INTRAMUSCULAR | Status: AC
  Filled 2021-09-30: qty 2

## 2021-09-30 MED ORDER — ONDANSETRON HCL 4 MG/2ML IJ SOLN
INTRAMUSCULAR | Status: AC
  Filled 2021-09-30: qty 2

## 2021-09-30 MED ORDER — LIDOCAINE HCL (CARDIAC) PF 100 MG/5ML IV SOSY
PREFILLED_SYRINGE | INTRAVENOUS | Status: DC | PRN
  Administered 2021-09-30: 80 mg via INTRAVENOUS

## 2021-09-30 MED ORDER — CHLORHEXIDINE GLUCONATE CLOTH 2 % EX PADS
6.0000 | MEDICATED_PAD | Freq: Once | CUTANEOUS | Status: AC
Start: 2021-09-30 — End: 2021-09-30
  Administered 2021-09-30: 6 via TOPICAL

## 2021-09-30 MED ORDER — SODIUM CHLORIDE 0.9 % IR SOLN
Status: DC | PRN
  Administered 2021-09-30: 3000 mL

## 2021-09-30 SURGICAL SUPPLY — 60 items
BAG COUNTER SPONGE SURGICOUNT (BAG) IMPLANT
BAG SPNG CNTER NS LX DISP (BAG)
BLADE HEX COATED 2.75 (ELECTRODE) ×1 IMPLANT
BLADE OSCILLATING/SAGITTAL (BLADE) ×1
BLADE SURG 15 STRL LF DISP TIS (BLADE) ×2 IMPLANT
BLADE SURG 15 STRL SS (BLADE) ×2
BLADE SW THK.38XMED LNG THN (BLADE) ×1 IMPLANT
BNDG CMPR 75X21 PLY HI ABS (MISCELLANEOUS) ×1
BNDG CMPR 9X4 STRL LF SNTH (GAUZE/BANDAGES/DRESSINGS) ×1
BNDG ELASTIC 3X5.8 VLCR STR LF (GAUZE/BANDAGES/DRESSINGS) ×1 IMPLANT
BNDG ELASTIC 4X5.8 VLCR STR LF (GAUZE/BANDAGES/DRESSINGS) IMPLANT
BNDG ELASTIC 6X5.8 VLCR STR LF (GAUZE/BANDAGES/DRESSINGS) ×1 IMPLANT
BNDG ESMARK 4X9 LF (GAUZE/BANDAGES/DRESSINGS) ×1 IMPLANT
BNDG GAUZE DERMACEA FLUFF 4 (GAUZE/BANDAGES/DRESSINGS) ×1 IMPLANT
BNDG GAUZE ELAST 4 BULKY (GAUZE/BANDAGES/DRESSINGS) IMPLANT
BNDG GZE DERMACEA 4 6PLY (GAUZE/BANDAGES/DRESSINGS) ×1
BUR EGG ELITE 4.0 (BURR) ×1 IMPLANT
COVER BACK TABLE 60X90IN (DRAPES) ×1 IMPLANT
CUFF TOURN SGL QUICK 18X4 (TOURNIQUET CUFF) IMPLANT
DRAPE EXTREMITY T 121X128X90 (DISPOSABLE) ×1 IMPLANT
DRAPE IMP U-DRAPE 54X76 (DRAPES) ×1 IMPLANT
DRAPE OEC MINIVIEW 54X84 (DRAPES) ×1 IMPLANT
DRSG EMULSION OIL 3X3 NADH (GAUZE/BANDAGES/DRESSINGS) ×1 IMPLANT
DURAPREP 26ML APPLICATOR (WOUND CARE) IMPLANT
ELECT REM PT RETURN 15FT ADLT (MISCELLANEOUS) ×1 IMPLANT
GAUZE 4X4 16PLY ~~LOC~~+RFID DBL (SPONGE) IMPLANT
GAUZE PACKING IODOFORM 1/4X15 (PACKING) IMPLANT
GAUZE SPONGE 4X4 12PLY STRL (GAUZE/BANDAGES/DRESSINGS) ×1 IMPLANT
GAUZE STRETCH 2X75IN STRL (MISCELLANEOUS) ×1 IMPLANT
GLOVE BIO SURGEON STRL SZ7.5 (GLOVE) ×2 IMPLANT
GLOVE BIOGEL PI IND STRL 7.5 (GLOVE) ×1 IMPLANT
GLOVE ECLIPSE 7.5 STRL STRAW (GLOVE) ×1 IMPLANT
GOWN STRL REUS W/ TWL LRG LVL3 (GOWN DISPOSABLE) ×1 IMPLANT
GOWN STRL REUS W/ TWL XL LVL3 (GOWN DISPOSABLE) ×1 IMPLANT
GOWN STRL REUS W/TWL LRG LVL3 (GOWN DISPOSABLE) ×1
GOWN STRL REUS W/TWL XL LVL3 (GOWN DISPOSABLE) ×1
HANDPIECE INTERPULSE COAX TIP (DISPOSABLE) ×1
KIT BASIN OR (CUSTOM PROCEDURE TRAY) ×1 IMPLANT
KIT TURNOVER KIT A (KITS) IMPLANT
NDL HYPO 25X1 1.5 SAFETY (NEEDLE) ×2 IMPLANT
NDL SAFETY ECLIP 18X1.5 (MISCELLANEOUS) IMPLANT
NEEDLE HYPO 25X1 1.5 SAFETY (NEEDLE) ×2 IMPLANT
NS IRRIG 1000ML POUR BTL (IV SOLUTION) IMPLANT
PADDING CAST ABS COTTON 4X4 ST (CAST SUPPLIES) ×1 IMPLANT
PENCIL SMOKE EVACUATOR (MISCELLANEOUS) ×1 IMPLANT
SET HNDPC FAN SPRY TIP SCT (DISPOSABLE) IMPLANT
STOCKINETTE 6  STRL (DRAPES) ×1
STOCKINETTE 6 STRL (DRAPES) ×1 IMPLANT
STRIP SUTURE WOUND CLOSURE 1/2 (MISCELLANEOUS) IMPLANT
SUT ETHILON 3 0 PS 1 (SUTURE) ×1 IMPLANT
SUT MNCRL AB 3-0 PS2 18 (SUTURE) ×1 IMPLANT
SUT MNCRL AB 4-0 PS2 18 (SUTURE) IMPLANT
SUT MON AB 5-0 PS2 18 (SUTURE) IMPLANT
SUT PROLENE 2 0 SH DA (SUTURE) ×1 IMPLANT
SUT PROLENE 3 0 PS 2 (SUTURE) ×1 IMPLANT
SYR 10ML LL (SYRINGE) IMPLANT
SYR BULB EAR ULCER 3OZ GRN STR (SYRINGE) ×1 IMPLANT
SYR CONTROL 10ML LL (SYRINGE) ×2 IMPLANT
TUBING CONNECTING 10 (TUBING) ×1 IMPLANT
UNDERPAD 30X36 HEAVY ABSORB (UNDERPADS AND DIAPERS) ×1 IMPLANT

## 2021-09-30 NOTE — Progress Notes (Signed)
History and Physical Interval Note:  09/30/2021 5:06 PM  Joel Kline  has presented today for surgery, with the diagnosis of Left foot abscess. Seen and evaluated by myself and Dr. Ardelle Anton earlier today, see his consultation note from that visit.  The various methods of treatment have been discussed with the patient and family. After consideration of risks, benefits and other options for treatment, the patient has consented to   Procedure(s): INCISION AND DRAINAGE OF LEFT FOOT ABSCESS (Left) as a surgical intervention.  The patient's history has been reviewed, patient examined, no change in status, stable for surgery.  I have reviewed the patient's chart and labs.  Questions were answered to the patient's satisfaction.     Jenelle Mages Othell Jaime

## 2021-09-30 NOTE — H&P (Signed)
Anesthesia H&P Update: History and Physical Exam reviewed; patient is OK for planned anesthetic and procedure. ? ?

## 2021-09-30 NOTE — Anesthesia Postprocedure Evaluation (Signed)
Anesthesia Post Note  Patient: Joel Kline  Procedure(s) Performed: INCISION AND DRAINAGE OF LEFT FOOT ABSCESS (Left)     Patient location during evaluation: PACU Anesthesia Type: General Level of consciousness: awake and alert Pain management: pain level controlled Vital Signs Assessment: post-procedure vital signs reviewed and stable Respiratory status: spontaneous breathing, nonlabored ventilation, respiratory function stable and patient connected to nasal cannula oxygen Cardiovascular status: blood pressure returned to baseline and stable Postop Assessment: no apparent nausea or vomiting Anesthetic complications: no   No notable events documented.  Last Vitals:  Vitals:   09/30/21 1748 09/30/21 1800  BP: 122/88 128/85  Pulse: 75 72  Resp: 19 15  Temp: 36.5 C   SpO2: 93% 93%    Last Pain:  Vitals:   09/30/21 1800  TempSrc:   PainSc: 0-No pain                 Collene Schlichter

## 2021-09-30 NOTE — Progress Notes (Signed)
PROGRESS NOTE  Joel Kline  OMV:[id number] DOB: 1956-01-12 DOA: 09/29/2021 PCP: Pcp, No   Brief Narrative: Patient is a 66 year old male with no significant past medical history who presented to the emergency department after his podiatrist referred him for left foot infection evaluation.  Patient had a small abrasion over the lateral aspect of the left foot roughly 5 months ago resulting in  development of erythema, swelling.  He was treated with antibiotics but it did not improve.  On 8/14, he had incision and drainage performed in the clinic and was treated with doxycycline.  The wound continued to drain requiring another I&D and further antibiotics.  On admission he was hemodynamically stable.  X-ray of the left foot was negative for osteomyelitis or fracture.  MRI of the left foot showed 1 to 2 cm abscess adjacent to the fifth MTP. Planning for I&D   Assessment & Plan:  Principal Problem:   Cellulitis of left foot   Cellulitis/abscess of the left foot: History as above.  Imaging finding as above.  Podiatry following, currently on broad-spectrum antibiotics.  Follow-up cultures.  Planning for I&D.        DVT prophylaxis:SCDs Start: 09/29/21 2032     Code Status: Full Code  Family Communication: None at bedside  Patient status:Obs  Patient is from :Home  Anticipated discharge GG:EZMO  Estimated DC date:1-2 days   Consultants: Podiatry  Procedures:None yet  Antimicrobials:  Anti-infectives (From admission, onward)    Start     Dose/Rate Route Frequency Ordered Stop   09/30/21 1000  vancomycin (VANCOREADY) IVPB 1250 mg/250 mL        1,250 mg 166.7 mL/hr over 90 Minutes Intravenous Every 12 hours 09/29/21 2105     09/30/21 0600  ceFEPIme (MAXIPIME) 2 g in sodium chloride 0.9 % 100 mL IVPB        2 g 200 mL/hr over 30 Minutes Intravenous Every 8 hours 09/29/21 2103     09/29/21 2100  vancomycin (VANCOREADY) IVPB 1750 mg/350 mL        1,750 mg 175 mL/hr over 120  Minutes Intravenous STAT 09/29/21 2052 09/30/21 0026   09/29/21 2100  ceFEPIme (MAXIPIME) 2 g in sodium chloride 0.9 % 100 mL IVPB        2 g 200 mL/hr over 30 Minutes Intravenous STAT 09/29/21 2052 09/29/21 2213       Subjective: Patient seen and examined at the bedside this morning.  Hemodynamically stable.  Comfortable.  Lying in bed.  No new complaints  Objective: Vitals:   09/29/21 2230 09/29/21 2319 09/30/21 0158 09/30/21 0551  BP: (!) 140/96 133/81 124/79 125/87  Pulse: 64 64 (!) 59 (!) 59  Resp: 17 18 19 15   Temp:  98.7 F (37.1 C) 98 F (36.7 C) 98 F (36.7 C)  TempSrc:  Oral Oral Oral  SpO2: 93% 96% 96% 99%  Weight:      Height:        Intake/Output Summary (Last 24 hours) at 09/30/2021 0756 Last data filed at 09/30/2021 0600 Gross per 24 hour  Intake 1106.51 ml  Output --  Net 1106.51 ml   Filed Weights   09/29/21 2037  Weight: 83.9 kg    Examination:  General exam: Overall comfortable, not in distress HEENT: PERRL Respiratory system:  no wheezes or crackles  Cardiovascular system: S1 & S2 heard, RRR.  Gastrointestinal system: Abdomen is nondistended, soft and nontender. Central nervous system: Alert and oriented Extremities: No edema, no clubbing ,no  cyanosis Skin: No rashes, no ulcers,no icterus  , wound on the left foot between fourth and fifth digits   Data Reviewed: I have personally reviewed following labs and imaging studies  CBC: Recent Labs  Lab 09/29/21 1133 09/30/21 0356  WBC 10.1 9.7  NEUTROABS 6.2  --   HGB 15.6 14.0  HCT 47.7 43.5  MCV 97.1 98.4  PLT 205 218   Basic Metabolic Panel: Recent Labs  Lab 09/29/21 1133 09/30/21 0356  NA 140 142  K 3.8 4.0  CL 108 109  CO2 25 27  GLUCOSE 131* 85  BUN 13 14  CREATININE 0.91 0.72  CALCIUM 9.3 8.8*     No results found for this or any previous visit (from the past 240 hour(s)).   Radiology Studies: MR FOOT LEFT W WO CONTRAST  Result Date: 09/30/2021 CLINICAL DATA:   Midfoot pain and bruising EXAM: MRI OF THE LEFT FOREFOOT WITHOUT AND WITH CONTRAST TECHNIQUE: Multiplanar, multisequence MR imaging of the left forefoot was performed both before and after administration of intravenous contrast. CONTRAST:  6mL GADAVIST GADOBUTROL 1 MMOL/ML IV SOLN COMPARISON:  09/29/2021 radiographs FINDINGS: Bones/Joint/Cartilage Chronic deformity of the distal fifth metatarsal shaft likely from an old fracture. No current findings of abnormal osseous edema or enhancement in the fifth digit despite the surrounding abnormal soft tissue signal. Edema signal anteriorly in the calcaneus, possibly with adjacent vascular remnant, only partially assessed today. Hindfoot configuration suggests pes planus. Large accessory navicular. Today's exam was not optimized to assess the hindfoot. Degenerative spurring in the midfoot and along the Lisfranc joint. Ligaments Lisfranc ligament intact. Muscles and Tendons Low-level edema tracking along the margins of the extensor digitorum longus and brevis musculotendinous structures along the forefoot, and also within the interosseous musculature between the fourth and fifth metatarsals distally. Soft tissues Dorsal subcutaneous edema, cellulitis not excluded based on low-grade enhancement. Dorsal to the fifth MTP joint and proximal phalanx, an ovoid 2.9 by 1.7 by 0.9 cm (volume = 2 cm^3) focus of low-grade enhancement is identified. This is not hypoenhancing centrally to indicate abscess although may reflect incipient abscess. Definite connectivity with the fifth MTP joint is not established. IMPRESSION: 1. A 2 cc focus of abnormal enhancement is present dorsal to the fifth metatarsal. Although a well-defined drainable collection is not identified, this could represent a spontaneously draining abscess, incipient abscess, or hematoma with associated inflammation. 2. Dorsal edema in the forefoot, suspicious for cellulitis. Some of this tracks along the extensor  musculotendinous structures and a trace amount extends into the interosseous muscles between the fourth and fifth distal metatarsals. 3. Chronic deformity of the distal fifth metatarsal shaft likely from an old fracture. 4. No osteomyelitis identified. Electronically Signed   By: Gaylyn Rong M.D.   On: 09/30/2021 07:06   DG Foot Complete Left  Result Date: 09/29/2021 CLINICAL DATA:  Foot wound. EXAM: LEFT FOOT - COMPLETE 3+ VIEW COMPARISON:  Radiograph September 07, 2021. FINDINGS: There is no evidence of fracture or dislocation. No cortical erosion or periosteal reaction identified. Pes planus. Calcaneal enthesophyte. Dorsal midfoot degenerative change. IMPRESSION: No evidence of osteomyelitis or acute fracture. Please note if continued clinical concern for osteomyelitis MRI is a more sensitive imaging modality. Electronically Signed   By: Maudry Mayhew M.D.   On: 09/29/2021 11:52    Scheduled Meds:  Continuous Infusions:  ceFEPime (MAXIPIME) IV 2 g (09/30/21 0609)   lactated ringers 75 mL/hr at 09/29/21 2329   vancomycin  LOS: 0 days   Burnadette Pop, MD [facility] P9/06/2021, 7:56 AM

## 2021-09-30 NOTE — Progress Notes (Signed)
Orthopedic Tech Progress Note Patient Details:  Joel Kline [birth year] [id number]  Patient ID: Joel Kline, male   DOB: 04-Jan-1956, 66 y.o.   MRN: [id number]  Joel Kline 09/30/2021, 6:52 PM Patient brought his own post op shoe. Reapplied shoe to patients left foot in room.

## 2021-09-30 NOTE — Transfer of Care (Signed)
Immediate Anesthesia Transfer of Care Note  Patient: Joel Kline  Procedure(s) Performed: INCISION AND DRAINAGE OF LEFT FOOT ABSCESS (Left)  Patient Location: PACU  Anesthesia Type:General  Level of Consciousness: awake, alert  and patient cooperative  Airway & Oxygen Therapy: Patient Spontanous Breathing  Post-op Assessment: Report given to RN, Post -op Vital signs reviewed and stable and Patient moving all extremities X 4  Post vital signs: Reviewed and stable  Last Vitals:  Vitals Value Taken Time  BP 122/88 09/30/21 1748  Temp    Pulse 75 09/30/21 1748  Resp 19 09/30/21 1748  SpO2 93 % 09/30/21 1748  Vitals shown include unvalidated device data.  Last Pain:  Vitals:   09/30/21 1548  TempSrc: Oral  PainSc: 0-No pain      Patients Stated Pain Goal: 2 (09/30/21 0111)  Complications: No notable events documented.

## 2021-09-30 NOTE — Plan of Care (Signed)

## 2021-09-30 NOTE — Plan of Care (Signed)

## 2021-09-30 NOTE — Op Note (Signed)
Full Operative Report  Date of Operation: 5:45 PM, 09/30/2021   Patient: Joel Kline - 66 y.o. male  Surgeon: Pilar Plate, DPM   Assistant: None  Pre op and Post op Diagnosis: Abscess Left foot dorsal 4th interspace  Procedure:  1. Incision and Drainage of abscess, left foot    Anesthesia: General  Collene Schlichter, MD  Anesthesiologist: Collene Schlichter, MD CRNA: Shanon Payor, CRNA   Estimated Blood Loss: Minimal   Hemostasis: 1) Anatomical dissection, mechanical compression, electrocautery 2) No tourniquet was used during the procedure  Implants: * No implants in log *  Materials: Prolene, iodoform 1/4 in packing  Injectables: 1) Pre-operatively: 10 cc of 0.5% marcaine plain 2) Post-operatively: None  Specimens: Abscess culture, Left foot for aerobic and anaerobic   Antibiotics: Abx given as scheduled from the floor  Drains: None  Complications: Patient tolerated the procedure well without complication.   Findings: as below  Indications for Procedure: LLEWELYN Kline presents to Pilar Plate, DPM with a chief complaint of draining wound and redness to the left dorsolateral forefoot. PT was admitted to the hospital and placed on IV abx. MRI was ordered that showed abscess left dorsolateral forefoot, no osteomyelitis. The patient has failed conservative treatments of various modalities. At this time the patient has elected to proceed with surgical correction. All alternatives, risks, and complications of the procedures were thoroughly explained to the patient. Patient exhibits appropriate understanding of all discussion points and informed consent was signed and obtained in the chart with no guarantees to surgical outcome given or implied.  Description of Procedure: Patient was brought to the operating room and remained on his hospital bed. Patient was secured twith safety belt, a contralateral SCD was placed, and all bony prominences were  well padded. A surgical timeout was performed and all members of the operating room, the procedure, and the surgical site were identified. LMA general and local anesthesia occurred. Local anesthetic as previously described was then injected about the operative field in a local infiltrative block.  The Left lower extremity was then prepped and draped in the usual sterile manner.  The following procedure then began.  Attention was directed to the Left lower extremity. Using a #15 blade, a linear incision was made or the dorsal distal 4th intermetatarsal space approx 3 cm. Sharp and blunt dissection was carried deep to subcutaneous tissues, being careful to protect all neurovascular structures. At this time, approximately 1 mL of purulent fluid was evacuated from the incision.  A deep tissue culture was obtained. Manual pressure was  applied to evacuate any remaining fluids. Using a hemostat, the subcutaneous tissue layers were explored for any sinus tracts, gross debris, and to free up any remaining fluid matter. The wound did not  probe to bone, capsule, tendon, or other deep structures. of saline was used under power pulse lavage to irrigate the surgical site. The cavity was partially closed with 3-0 prolene suture and packed with sterile packing material and dry sterile dressing was applied.   The surgical site was then dressed with betadine, adaptic, 4x4 kerlix, ace wrap. The patient tolerated both the procedure and anesthesia well with vital signs stable throughout. The patient was transferred from the OR to recovery under the discretion of anesthesia.  Condition: Patient was taken to PACU in good condition and all vital signs stable and neurovascular status intact to the operative limb.  Discharge: Patient will be readmitted to the floor for ongoing abx  and wound care.  Sloane Palmer, Jenelle Mages, DPM will follow the patient throughout the entire post-operative course and the patient is aware of  all post-operative protocols in place. The patient will follow the protocol of rest, ice, and elevation. The patient will be weightbearing in a post op shoe to the operative limb until further instructed. The dressing is to remain clean, dry, and intact.   Carlena Hurl, DPM

## 2021-09-30 NOTE — Progress Notes (Signed)
ABI has been completed.   Results can be found under chart review under CV PROC. 09/30/2021 2:10 PM Wanette Robison RVT, RDMS

## 2021-09-30 NOTE — Anesthesia Procedure Notes (Signed)
Procedure Name: LMA Insertion Date/Time: 09/30/2021 5:10 PM  Performed by: Shanon Payor, CRNAPre-anesthesia Checklist: Patient identified, Emergency Drugs available, Suction available, Patient being monitored and Timeout performed Patient Re-evaluated:Patient Re-evaluated prior to induction Oxygen Delivery Method: Circle system utilized Preoxygenation: Pre-oxygenation with 100% oxygen Induction Type: IV induction LMA: LMA inserted LMA Size: 4.0 Number of attempts: 1 Placement Confirmation: positive ETCO2, CO2 detector and breath sounds checked- equal and bilateral Tube secured with: Tape Dental Injury: Teeth and Oropharynx as per pre-operative assessment

## 2021-09-30 NOTE — Anesthesia Preprocedure Evaluation (Addendum)
Anesthesia Evaluation  Patient identified by MRN, date of birth, ID band Patient awake    Reviewed: Allergy & Precautions, NPO status , Patient's Chart, lab work & pertinent test results  Airway Mallampati: III  TM Distance: >3 FB Neck ROM: Full    Dental  (+) Teeth Intact, Dental Advisory Given, Missing   Pulmonary neg pulmonary ROS,    Pulmonary exam normal breath sounds clear to auscultation       Cardiovascular negative cardio ROS Normal cardiovascular exam Rhythm:Regular Rate:Normal     Neuro/Psych negative neurological ROS  negative psych ROS   GI/Hepatic Neg liver ROS, GERD  Medicated,  Endo/Other  negative endocrine ROS  Renal/GU negative Renal ROS     Musculoskeletal Left foot abscess    Abdominal   Peds  Hematology negative hematology ROS (+)   Anesthesia Other Findings   Reproductive/Obstetrics                            Anesthesia Physical Anesthesia Plan  ASA: 2  Anesthesia Plan: General   Post-op Pain Management: Tylenol PO (pre-op)*   Induction: Intravenous  PONV Risk Score and Plan: 2 and Dexamethasone, Midazolam and Ondansetron  Airway Management Planned: LMA  Additional Equipment:   Intra-op Plan:   Post-operative Plan: Extubation in OR  Informed Consent: I have reviewed the patients History and Physical, chart, labs and discussed the procedure including the risks, benefits and alternatives for the proposed anesthesia with the patient or authorized representative who has indicated his/her understanding and acceptance.     Dental advisory given  Plan Discussed with: CRNA  Anesthesia Plan Comments:        Anesthesia Quick Evaluation

## 2021-09-30 NOTE — Consult Note (Signed)
Reason for Consult: Abscess Referring Physician: DR. Burnadette Pop, MD  Joel Kline is an 66 y.o. male.  HPI: 66 year old male with no significant past medical history presents to the hospital from clinic for left foot recurrent abscess.  Has been under the care of Dr. Ralene Cork.  There is an I&D performed in the clinic.  Also states he been to urgent care and he had cultures performed as well which she states apparently did not grow anything.  Given concern of continued abscess she was directed to the emergency room and MRI was performed.  Denies any fever or chills.  History reviewed. No pertinent past medical history.  History reviewed. No pertinent surgical history.  History reviewed. No pertinent family history.  Social History:  reports that he has never smoked. He has never used smokeless tobacco. He reports that he does not currently use alcohol. He reports that he does not use drugs.  Allergies: No Known Allergies  Medications: I have reviewed the patient's current medications.  Results for orders placed or performed during the hospital encounter of 09/29/21 (from the past 48 hour(s))  Basic metabolic panel     Status: Abnormal   Collection Time: 09/29/21 11:33 AM  Result Value Ref Range   Sodium 140 135 - 145 mmol/L   Potassium 3.8 3.5 - 5.1 mmol/L   Chloride 108 98 - 111 mmol/L   CO2 25 22 - 32 mmol/L   Glucose, Bld 131 (H) 70 - 99 mg/dL    Comment: Glucose reference range applies only to samples taken after fasting for at least 8 hours.   BUN 13 8 - 23 mg/dL   Creatinine, Ser 9.37 0.61 - 1.24 mg/dL   Calcium 9.3 8.9 - 90.2 mg/dL   GFR, Estimated >40 >97 mL/min    Comment: (NOTE) Calculated using the CKD-EPI Creatinine Equation (2021)    Anion gap 7 5 - 15    Comment: Performed at [facility], [address] [place], [place] [address]  CBC with Differential     Status: None   Collection Time: 09/29/21 11:33 AM  Result Value Ref Range   WBC 10.1  4.0 - 10.5 K/uL   RBC 4.91 4.22 - 5.81 MIL/uL   Hemoglobin 15.6 13.0 - 17.0 g/dL   HCT 92.4 26.8 - 34.1 %   MCV 97.1 80.0 - 100.0 fL   MCH 31.8 26.0 - 34.0 pg   MCHC 32.7 30.0 - 36.0 g/dL   RDW 96.2 22.9 - 79.8 %   Platelets 205 150 - 400 K/uL   nRBC 0.0 0.0 - 0.2 %   Neutrophils Relative % 61 %   Neutro Abs 6.2 1.7 - 7.7 K/uL   Lymphocytes Relative 28 %   Lymphs Abs 2.8 0.7 - 4.0 K/uL   Monocytes Relative 7 %   Monocytes Absolute 0.7 0.1 - 1.0 K/uL   Eosinophils Relative 3 %   Eosinophils Absolute 0.3 0.0 - 0.5 K/uL   Basophils Relative 0 %   Basophils Absolute 0.0 0.0 - 0.1 K/uL   Immature Granulocytes 1 %   Abs Immature Granulocytes 0.05 0.00 - 0.07 K/uL    Comment: Performed at [facility], [address], [place], [place] [address]  Lactic acid, plasma     Status: None   Collection Time: 09/29/21 11:33 AM  Result Value Ref Range   Lactic Acid, Venous 1.4 0.5 - 1.9 mmol/L    Comment: Performed at [facility], [address], [place], [place] [address]  Sedimentation rate     Status: None   Collection Time: 09/29/21  4:46 PM  Result Value Ref Range   Sed Rate 7 0 - 16 mm/hr    Comment: Performed at [facility], [address] [place], [place] [address]  C-reactive protein     Status: None   Collection Time: 09/29/21  4:46 PM  Result Value Ref Range   CRP <0.5 <1.0 mg/dL    Comment: Performed at [facility], [address], [place], [place] [address]  Basic metabolic panel     Status: Abnormal   Collection Time: 09/30/21  3:56 AM  Result Value Ref Range   Sodium 142 135 - 145 mmol/L   Potassium 4.0 3.5 - 5.1 mmol/L   Chloride 109 98 - 111 mmol/L   CO2 27 22 - 32 mmol/L   Glucose, Bld 85 70 - 99 mg/dL    Comment: Glucose reference range applies only to samples taken after fasting for at least 8 hours.   BUN 14 8 - 23 mg/dL   Creatinine, Ser 0.94 0.61 - 1.24 mg/dL   Calcium 8.8 (L) 8.9 - 10.3 mg/dL    GFR, Estimated >70 >96 mL/min    Comment: (NOTE) Calculated using the CKD-EPI Creatinine Equation (2021)    Anion gap 6 5 - 15    Comment: Performed at [facility], [address] [place], [place] [address]  CBC     Status: None   Collection Time: 09/30/21  3:56 AM  Result Value Ref Range   WBC 9.7 4.0 - 10.5 K/uL   RBC 4.42 4.22 - 5.81 MIL/uL   Hemoglobin 14.0 13.0 - 17.0 g/dL   HCT 29.4 76.5 - 46.5 %   MCV 98.4 80.0 - 100.0 fL   MCH 31.7 26.0 - 34.0 pg   MCHC 32.2 30.0 - 36.0 g/dL   RDW 03.5 46.5 - 68.1 %   Platelets 218 150 - 400 K/uL   nRBC 0.0 0.0 - 0.2 %    Comment: Performed at [facility], [address], [place], [place] [address]    MR FOOT LEFT W WO CONTRAST  Result Date: 09/30/2021 CLINICAL DATA:  Midfoot pain and bruising EXAM: MRI OF THE LEFT FOREFOOT WITHOUT AND WITH CONTRAST TECHNIQUE: Multiplanar, multisequence MR imaging of the left forefoot was performed both before and after administration of intravenous contrast. CONTRAST:  90mL GADAVIST GADOBUTROL 1 MMOL/ML IV SOLN COMPARISON:  09/29/2021 radiographs FINDINGS: Bones/Joint/Cartilage Chronic deformity of the distal fifth metatarsal shaft likely from an old fracture. No current findings of abnormal osseous edema or enhancement in the fifth digit despite the surrounding abnormal soft tissue signal. Edema signal anteriorly in the calcaneus, possibly with adjacent vascular remnant, only partially assessed today. Hindfoot configuration suggests pes planus. Large accessory navicular. Today's exam was not optimized to assess the hindfoot. Degenerative spurring in the midfoot and along the Lisfranc joint. Ligaments Lisfranc ligament intact. Muscles and Tendons Low-level edema tracking along the margins of the extensor digitorum longus and brevis musculotendinous structures along the forefoot, and also within the interosseous musculature between the fourth and fifth metatarsals distally. Soft  tissues Dorsal subcutaneous edema, cellulitis not excluded based on low-grade enhancement. Dorsal to the fifth MTP joint and proximal phalanx, an ovoid 2.9 by 1.7 by 0.9 cm (volume = 2 cm^3) focus of low-grade enhancement is identified. This is not hypoenhancing centrally to indicate abscess although may reflect incipient  abscess. Definite connectivity with the fifth MTP joint is not established. IMPRESSION: 1. A 2 cc focus of abnormal enhancement is present dorsal to the fifth metatarsal. Although a well-defined drainable collection is not identified, this could represent a spontaneously draining abscess, incipient abscess, or hematoma with associated inflammation. 2. Dorsal edema in the forefoot, suspicious for cellulitis. Some of this tracks along the extensor musculotendinous structures and a trace amount extends into the interosseous muscles between the fourth and fifth distal metatarsals. 3. Chronic deformity of the distal fifth metatarsal shaft likely from an old fracture. 4. No osteomyelitis identified. Electronically Signed   By: Gaylyn Rong M.D.   On: 09/30/2021 07:06   DG Foot Complete Left  Result Date: 09/29/2021 CLINICAL DATA:  Foot wound. EXAM: LEFT FOOT - COMPLETE 3+ VIEW COMPARISON:  Radiograph September 07, 2021. FINDINGS: There is no evidence of fracture or dislocation. No cortical erosion or periosteal reaction identified. Pes planus. Calcaneal enthesophyte. Dorsal midfoot degenerative change. IMPRESSION: No evidence of osteomyelitis or acute fracture. Please note if continued clinical concern for osteomyelitis MRI is a more sensitive imaging modality. Electronically Signed   By: Maudry Mayhew M.D.   On: 09/29/2021 11:52    Review of Systems Blood pressure (!) 135/94, pulse (!) 57, temperature 98.4 F (36.9 C), temperature source Oral, resp. rate 18, height 6' (1.829 m), weight 83.9 kg, SpO2 99 %. Physical Exam General: AAO x3, NAD  Dermatological: On the dorsal lateral aspect of  the patient's left foot just proximal to the MPJ there is localized area of edema erythema is normal no fluctuance noted there is no crepitation.  Minimal edema to the foot there is no ascending cellulitis.  Vascular: Dorsalis Pedis artery and Posterior Tibial artery pedal pulses are 2/4 bilateral with immedate capillary fill time. There is no pain with calf compression, swelling, warmth, erythema.   Neruologic: Grossly intact via light touch bilateral.   Musculoskeletal: Palpation on the area of the abscess.  Muscular strength 5/5 in all groups tested bilateral.  Assessment/Plan: 66 year old male with abscess left foot  -MRI was reviewed with the patient.  There is a 2 cm abnormal enhancement in the dorsal fifth metatarsal.  Given concern of reoccurrence of infection discussed with him incision and drainage, washout.  We discussed the surgery/postoperative course.  Discussed risks of surgery including spread of infection, delayed/nonhealing, need for further surgery, amputation as well as general risk of surgery.  He is in agreement we will proceed with surgery later today for incision and drainage.  Either myself or Dr. Levester Fresh will do the procedure. The patient is aware of this.  All questions answered. -Continue n.p.o. -ABI ordered- WNL  Vivi Barrack 09/30/2021, 10:49 AM

## 2021-10-01 ENCOUNTER — Encounter (HOSPITAL_COMMUNITY): Payer: Self-pay | Admitting: Podiatry

## 2021-10-01 MED ORDER — OXYCODONE HCL 5 MG PO TABS: 5.0000 mg | ORAL_TABLET | ORAL | 0 refills | Status: DC | PRN

## 2021-10-01 MED ORDER — AMOXICILLIN-POT CLAVULANATE 875-125 MG PO TABS: 1.0000 | ORAL_TABLET | Freq: Two times a day (BID) | ORAL | 0 refills | Status: AC

## 2021-10-01 MED ORDER — AMOXICILLIN-POT CLAVULANATE 875-125 MG PO TABS
1.0000 | ORAL_TABLET | Freq: Two times a day (BID) | ORAL | Status: DC
  Administered 2021-10-01: 1 via ORAL
  Filled 2021-10-01: qty 1

## 2021-10-01 NOTE — Plan of Care (Signed)
  Problem: Education: Goal: Knowledge of General Education information will improve Description: Including pain rating scale, medication(s)/side effects and non-pharmacologic comfort measures Outcome: Progressing   Problem: Health Behavior/Discharge Planning: Goal: Ability to manage health-related needs will improve Outcome: Progressing   Problem: Clinical Measurements: Goal: Ability to maintain clinical measurements within normal limits will improve Outcome: Progressing   Problem: Nutrition: Goal: Adequate nutrition will be maintained Outcome: Progressing   Problem: Coping: Goal: Level of anxiety will decrease Outcome: Progressing   Problem: Elimination: Goal: Will not experience complications related to bowel motility Outcome: Progressing   Problem: Pain Managment: Goal: General experience of comfort will improve Outcome: Progressing   Problem: Skin Integrity: Goal: Risk for impaired skin integrity will decrease Outcome: Progressing

## 2021-10-01 NOTE — Discharge Summary (Addendum)
Physician Discharge Summary  Midge MiniumJohn M Jolliffe YNW:[id number] DOB: 1955/04/17 DOA: 09/29/2021  PCP: Pcp, No  Admit date: 09/29/2021 Discharge date: 10/01/2021  Admitted From: Home Disposition:  Home  Discharge Condition:Stable CODE STATUS:FULL Diet recommendation: Regular   Brief/Interim Summary: Patient is a 66 year old male with no significant past medical history who presented to the emergency department after his podiatrist referred him for left foot infection evaluation.  Patient had a small abrasion over the lateral aspect of the left foot roughly 5 months ago resulting in  development of erythema, swelling.  He was treated with antibiotics but it did not improve.  On 8/14, he had incision and drainage performed in the clinic and was treated with doxycycline.  The wound continued to drain requiring another I&D and further antibiotics.  On admission he was hemodynamically stable.  X-ray of the left foot was negative for osteomyelitis or fracture.  MRI of the left foot showed 1 to 2 cm abscess adjacent to the fifth MTP. S/P I&D on 9/6.  Wound culture showing gram-positive cocci on pais.  Podiatry cleared him for discharge with oral antibiotics.  He will follow-up with podiatry as an outpatient.  Following problems were addressed during his hospitalization:  Cellulitis/abscess of the left foot: History as above.  Imaging finding as above.  Podiatry was following,S/P I&D on 9/6.  Wound culture showing gram-positive cocci on pais.  Podiatry cleared him for discharge with oral antibiotics.  He will follow-up with podiatry as an outpatient.  He will be discharged on 10 days course of Augmentin.    Discharge Diagnoses:  Principal Problem:   Cellulitis of left foot    Discharge Instructions  Discharge Instructions     Diet general   Complete by: As directed    Discharge instructions   Complete by: As directed    1)Please take prescribed medications as instructed 2) Follow up with your  podiatrist next week Monday or Tuesday for dressing change. Until then keep dressing and foot clean dry intact   Increase activity slowly   Complete by: As directed    No wound care   Complete by: As directed       Allergies as of 10/01/2021   No Known Allergies      Medication List     STOP taking these medications    doxycycline 100 MG tablet Commonly known as: VIBRA-TABS       TAKE these medications    amoxicillin-clavulanate 875-125 MG tablet Commonly known as: AUGMENTIN Take 1 tablet by mouth every 12 (twelve) hours for 10 days.   ibuprofen 200 MG tablet Commonly known as: ADVIL Take 200 mg by mouth every 6 (six) hours as needed for moderate pain. Notes to patient: Resume home regimen   MAGNESIUM PO Take 1 tablet by mouth daily. Notes to patient: Resume home regimen   omeprazole 20 MG tablet Commonly known as: PRILOSEC OTC Take 20 mg by mouth daily. Notes to patient: Resume home regimen   oxyCODONE 5 MG immediate release tablet Commonly known as: Oxy IR/ROXICODONE Take 1 tablet (5 mg total) by mouth every 4 (four) hours as needed for moderate pain.        Follow-up Information     Standiford, Jenelle Mageslexander F, DPM. Schedule an appointment as soon as possible for a visit.   Specialty: Actuaryodiatry Contact information: [address] [place] [address] [phone]                No Known Allergies  Consultations: Podiatry   Procedures/Studies: VAS [place] ABI WITH/WO TBI  Result Date: 09/30/2021  LOWER EXTREMITY DOPPLER STUDY Patient Name:  RAYMIR FROMMELT  Date of Exam:   09/30/2021 Medical Rec #: [id number]       Accession #:    [id number] Date of Birth: 08/17/1955      Patient Gender: M Patient Age:   66 years Exam Location:  [facility] Procedure:      VAS [place] ABI WITH/WO TBI Referring Phys: Ovid Curd --------------------------------------------------------------------------------  Indications: Cellulitis of left  foot (non healing) High Risk Factors: None.  Performing Technologist: Ernestene Mention RVT, RDMS  Examination Guidelines: A complete evaluation includes at minimum, Doppler waveform signals and systolic blood pressure reading at the level of bilateral brachial, anterior tibial, and posterior tibial arteries, when vessel segments are accessible. Bilateral testing is considered an integral part of a complete examination. Photoelectric Plethysmograph (PPG) waveforms and toe systolic pressure readings are included as required and additional duplex testing as needed. Limited examinations for reoccurring indications may be performed as noted.  ABI Findings: +--------+------------------+-----+---------+--------+ Right   Rt Pressure (mmHg)IndexWaveform Comment  +--------+------------------+-----+---------+--------+ [id number]                    triphasic         +--------+------------------+-----+---------+--------+ PTA     155               1.19 triphasic         +--------+------------------+-----+---------+--------+ DP      149               1.15 triphasic         +--------+------------------+-----+---------+--------+ +--------+------------------+-----+---------+-------+ Left    Lt Pressure (mmHg)IndexWaveform Comment +--------+------------------+-----+---------+-------+ [id number]                    triphasic        +--------+------------------+-----+---------+-------+ PTA     154               1.18 triphasic        +--------+------------------+-----+---------+-------+ DP      136               1.05 triphasic        +--------+------------------+-----+---------+-------+ +-------+-----------+-----------+------------+------------+ ABI/TBIToday's ABIToday's TBIPrevious ABIPrevious TBI +-------+-----------+-----------+------------+------------+ Right  1.19                                           +-------+-----------+-----------+------------+------------+ Left   1.18                                            +-------+-----------+-----------+------------+------------+  Summary: Right: Resting right ankle-brachial index is within normal range. Left: Resting left ankle-brachial index is within normal range. *See table(s) above for measurements and observations.  Electronically signed by Coral Else MD on 09/30/2021 at 10:30:20 PM.    Final    MR FOOT LEFT W WO CONTRAST  Result Date: 09/30/2021 CLINICAL DATA:  Midfoot pain and bruising EXAM: MRI OF THE LEFT FOREFOOT WITHOUT AND WITH CONTRAST TECHNIQUE: Multiplanar, multisequence MR imaging of the left forefoot was performed both before and after administration of intravenous contrast. CONTRAST:  41mL GADAVIST GADOBUTROL 1 MMOL/ML IV SOLN COMPARISON:  09/29/2021 radiographs FINDINGS: Bones/Joint/Cartilage Chronic deformity of the  distal fifth metatarsal shaft likely from an old fracture. No current findings of abnormal osseous edema or enhancement in the fifth digit despite the surrounding abnormal soft tissue signal. Edema signal anteriorly in the calcaneus, possibly with adjacent vascular remnant, only partially assessed today. Hindfoot configuration suggests pes planus. Large accessory navicular. Today's exam was not optimized to assess the hindfoot. Degenerative spurring in the midfoot and along the Lisfranc joint. Ligaments Lisfranc ligament intact. Muscles and Tendons Low-level edema tracking along the margins of the extensor digitorum longus and brevis musculotendinous structures along the forefoot, and also within the interosseous musculature between the fourth and fifth metatarsals distally. Soft tissues Dorsal subcutaneous edema, cellulitis not excluded based on low-grade enhancement. Dorsal to the fifth MTP joint and proximal phalanx, an ovoid 2.9 by 1.7 by 0.9 cm (volume = 2 cm^3) focus of low-grade enhancement is identified. This is not hypoenhancing centrally to indicate abscess although may reflect incipient  abscess. Definite connectivity with the fifth MTP joint is not established. IMPRESSION: 1. A 2 cc focus of abnormal enhancement is present dorsal to the fifth metatarsal. Although a well-defined drainable collection is not identified, this could represent a spontaneously draining abscess, incipient abscess, or hematoma with associated inflammation. 2. Dorsal edema in the forefoot, suspicious for cellulitis. Some of this tracks along the extensor musculotendinous structures and a trace amount extends into the interosseous muscles between the fourth and fifth distal metatarsals. 3. Chronic deformity of the distal fifth metatarsal shaft likely from an old fracture. 4. No osteomyelitis identified. Electronically Signed   By: Gaylyn Rong M.D.   On: 09/30/2021 07:06   DG Foot Complete Left  Result Date: 09/29/2021 CLINICAL DATA:  Foot wound. EXAM: LEFT FOOT - COMPLETE 3+ VIEW COMPARISON:  Radiograph September 07, 2021. FINDINGS: There is no evidence of fracture or dislocation. No cortical erosion or periosteal reaction identified. Pes planus. Calcaneal enthesophyte. Dorsal midfoot degenerative change. IMPRESSION: No evidence of osteomyelitis or acute fracture. Please note if continued clinical concern for osteomyelitis MRI is a more sensitive imaging modality. Electronically Signed   By: Maudry Mayhew M.D.   On: 09/29/2021 11:52   DG Foot Complete Left  Result Date: 09/07/2021 Please see detailed radiograph report in office note.     Subjective: Patient seen and examined at the bedside this morning.  Hemodynamically stable.  Comfortable.  Denies any worsening foot pain.  Eager to go home.  Discharge Exam: Vitals:   10/01/21 0628 10/01/21 0931  BP: (!) 133/91 136/86  Pulse: 64 (!) 55  Resp: 18 17  Temp: 97.9 F (36.6 C) 98.7 F (37.1 C)  SpO2: 95% 97%   Vitals:   09/30/21 2213 10/01/21 0206 10/01/21 0628 10/01/21 0931  BP: 117/86 127/82 (!) 133/91 136/86  Pulse: 65 62 64 (!) 55  Resp: Temp: 98.2 F (36.8 C) 97.9 F (36.6 C) 97.9 F (36.6 C) 98.7 F (37.1 C)  TempSrc: Oral Oral Oral Oral  SpO2: 94% 95% 95% 97%  Weight:      Height:        General: Pt is alert, awake, not in acute distress Cardiovascular: RRR, S1/S2 +, no rubs, no gallops Respiratory: CTA bilaterally, no wheezing, no rhonchi Abdominal: Soft, NT, ND, bowel sounds + Extremities: no edema, no cyanosis,left foot boot    The results of significant diagnostics from this hospitalization (including imaging, microbiology, ancillary and laboratory) are listed below for reference.     Microbiology: Recent Results (from the past  240 hour(s))  Culture, blood (Routine X 2) w Reflex to ID Panel     Status: None (Preliminary result)   Collection Time: 09/30/21  9:10 AM   Specimen: BLOOD RIGHT ARM  Result Value Ref Range Status   Specimen Description   Final    BLOOD RIGHT ARM Performed at [facility], [address], [place], [place] [address]    Special Requests   Final    BOTTLES DRAWN AEROBIC AND ANAEROBIC Blood Culture adequate volume Performed at [facility], [address], [place], [place] [address]    Culture   Final    NO GROWTH < 24 HOURS Performed at [facility], [address], [place], [place] [address]    Report Status PENDING  Incomplete  Culture, blood (Routine X 2) w Reflex to ID Panel     Status: None (Preliminary result)   Collection Time: 09/30/21  9:23 AM   Specimen: BLOOD RIGHT HAND  Result Value Ref Range Status   Specimen Description   Final    BLOOD RIGHT HAND Performed at [facility], [address] [place], [place] [address]    Special Requests   Final    BOTTLES DRAWN AEROBIC AND ANAEROBIC Blood Culture adequate volume Performed at [facility], [address], [place], [place] [address]    Culture   Final    NO GROWTH < 24 HOURS Performed at [facility],  [address], [place], [place] [address]    Report Status PENDING  Incomplete  Surgical pcr screen     Status: None   Collection Time: 09/30/21  2:09 PM   Specimen: Nasal Mucosa; Nasal Swab  Result Value Ref Range Status   MRSA, PCR NEGATIVE NEGATIVE Final   Staphylococcus aureus NEGATIVE NEGATIVE Final    Comment: (NOTE) The Xpert SA Assay (FDA approved for NASAL specimens in patients 4 years of age and older), is one component of a comprehensive surveillance program. It is not intended to diagnose infection nor to guide or monitor treatment. Performed at [facility], [address] [place], [place] [address]   Aerobic/Anaerobic Culture w Gram Stain (surgical/deep wound)     Status: None (Preliminary result)   Collection Time: 09/30/21  5:22 PM   Specimen: PATH Other; Tissue  Result Value Ref Range Status   Specimen Description   Final    TISSUE Performed at [facility], [address], [place], [place] [address]    Special Requests LEFT FOOT SWAB  Final   Gram Stain RARE GRAM POSITIVE COCCI IN PAIRS NO WBC SEEN   Final   Culture   Final    NO GROWTH < 12 HOURS Performed at [facility], [address] [place], [place] [address]    Report Status PENDING  Incomplete     Labs: BNP (last 3 results) No results for input(s): "BNP" in the last 8760 hours. Basic Metabolic Panel: Recent Labs  Lab 09/29/21 1133 09/30/21 0356  NA 140 142  K 3.8 4.0  CL 108 109  CO2 25 27  GLUCOSE 131* 85  BUN 13 14  CREATININE 0.91 0.72  CALCIUM 9.3 8.8*   Liver Function Tests: No results for input(s): "AST", "ALT", "ALKPHOS", "BILITOT", "PROT", "ALBUMIN" in the last 168 hours. No results for input(s): "LIPASE", "AMYLASE" in the last 168 hours. No results for input(s): "AMMONIA" in the last 168 hours. CBC: Recent Labs  Lab 09/29/21 1133 09/30/21 0356  WBC 10.1 9.7  NEUTROABS 6.2  --   HGB 15.6 14.0  HCT 47.7 43.5  MCV 97.1 98.4   PLT 205 218   Cardiac Enzymes: No results for input(s): "CKTOTAL", "CKMB", "CKMBINDEX", "TROPONINI" in the last 168 hours. BNP: Invalid input(s): "POCBNP" CBG: No results for input(s): "GLUCAP" in the last 168 hours. D-Dimer No results for input(s): "DDIMER" in the last 72 hours. Hgb A1c No results for input(s): "HGBA1C" in the last 72 hours. Lipid Profile No results for input(s): "CHOL", "HDL", "LDLCALC", "TRIG", "CHOLHDL", "LDLDIRECT" in the last 72 hours. Thyroid function studies No results for input(s): "TSH", "T4TOTAL", "T3FREE", "THYROIDAB" in the last 72 hours.  Invalid input(s): "FREET3" Anemia work up No results for input(s): "VITAMINB12", "FOLATE", "FERRITIN", "TIBC", "IRON", "RETICCTPCT" in the last 72 hours. Urinalysis No results found for: "COLORURINE", "APPEARANCEUR", "LABSPEC", "PHURINE", "GLUCOSEU", "HGBUR", "BILIRUBINUR", "KETONESUR", "PROTEINUR", "UROBILINOGEN", "NITRITE", "LEUKOCYTESUR" Sepsis Labs Recent Labs  Lab 09/29/21 1133 09/30/21 0356  WBC 10.1 9.7   Microbiology Recent Results (from the past 240 hour(s))  Culture, blood (Routine X 2) w Reflex to ID Panel     Status: None (Preliminary result)   Collection Time: 09/30/21  9:10 AM   Specimen: BLOOD RIGHT ARM  Result Value Ref Range Status   Specimen Description   Final    BLOOD RIGHT ARM Performed at [facility], [address] [place], [place] [address]    Special Requests   Final    BOTTLES DRAWN AEROBIC AND ANAEROBIC Blood Culture adequate volume Performed at [facility] [address], [place], [place] [address]    Culture   Final    NO GROWTH < 24 HOURS Performed at [facility], [address] [place], [place] [address]    Report Status PENDING  Incomplete  Culture, blood (Routine X 2) w Reflex to ID Panel     Status: None (Preliminary result)   Collection Time: 09/30/21  9:23 AM   Specimen: BLOOD RIGHT HAND  Result Value Ref Range  Status   Specimen Description   Final    BLOOD RIGHT HAND Performed at [facility], [address], [place], [place] [address]    Special Requests   Final    BOTTLES DRAWN AEROBIC AND ANAEROBIC Blood Culture adequate volume Performed at [facility], [address], [place], [place] [address]    Culture   Final    NO GROWTH < 24 HOURS Performed at [facility], [address], [place], [place] [address]    Report Status PENDING  Incomplete  Surgical pcr screen     Status: None   Collection Time: 09/30/21  2:09 PM   Specimen: Nasal Mucosa; Nasal Swab  Result Value Ref Range Status   MRSA, PCR NEGATIVE NEGATIVE Final   Staphylococcus aureus NEGATIVE NEGATIVE Final    Comment: (NOTE) The Xpert SA Assay (FDA approved for NASAL specimens in patients 61 years of age and older), is one component of a comprehensive surveillance program. It is not intended to diagnose infection nor to guide or monitor treatment. Performed at [facility], [address], [place], [place] [address]   Aerobic/Anaerobic Culture w Gram Stain (surgical/deep wound)     Status: None (Preliminary result)   Collection Time: 09/30/21  5:22 PM   Specimen: PATH Other; Tissue  Result Value Ref Range Status   Specimen Description   Final  TISSUE Performed at [facility], [address], [place], [place] [address]    Special Requests LEFT FOOT SWAB  Final   Gram Stain RARE GRAM POSITIVE COCCI IN PAIRS NO WBC SEEN   Final   Culture   Final    NO GROWTH < 12 HOURS Performed at [facility], [address] [place], [place] [address]    Report Status PENDING  Incomplete    Please note: You were cared for by a hospitalist during your hospital stay. Once you are discharged, your primary care physician will handle any further medical issues. Please note that NO REFILLS for any discharge medications will be authorized once  you are discharged, as it is imperative that you return to your primary care physician (or establish a relationship with a primary care physician if you do not have one) for your post hospital discharge needs so that they can reassess your need for medications and monitor your lab values.    Time coordinating discharge: 40 minutes  SIGNED:   Burnadette Pop, MD  [facility] 10/01/2021, 12:38 PM Pager [phone]  If 7PM-7AM, please contact night-coverage [email or web address] Password [email or web address]

## 2021-10-01 NOTE — Progress Notes (Signed)
  Subjective:  Patient ID: Joel Kline, male    DOB: 05/24/1955,  MRN: [id number]  Chief Complaint  Patient presents with   Wound Infection    DOS: 09/30/2021 Procedure: Incision and Drainage of Left foot abscess  66 y.o. male with no significant past medical history seen POD 1 s/p left foot I&D for dorsolateral foot abscess. He is doing well postop, pain mostly controlled after some pain medication this AM.   Review of Systems: Negative except as noted in the HPI. Denies N/V/F/Ch.   Objective:   Vitals:   10/01/21 0206 10/01/21 0628  BP: 127/82 (!) 133/91  Pulse: 62 64  Resp: 18 18  Temp: 97.9 F (36.6 C) 97.9 F (36.6 C)  SpO2: 95% 95%   Body mass index is 25.09 kg/m. Constitutional Well developed. Well nourished.  Vascular Foot warm and well perfused. Capillary refill normal to all digits.  Calf is soft and supple, no posterior calf or knee pain, negative Homans' sign  Neurologic Normal speech. Oriented to person, place, and time. Epicritic sensation to light touch grossly present bilaterally.  Dermatologic Decreased erythema present around site of I&D. No active drainage. No flucutance. Packing strip in place in central wound.   Orthopedic: Tenderness to palpation noted about the surgical site.     Assessment:   1. Wound infection   2. Cellulitis of left foot    Plan:  Patient was evaluated and treated and all questions answered.  S/p foot surgery left foot abscess I&D. -Progressing as expected post-operatively. Decreased erythema at the incision site. Much improved from prior. See picture above. Stable for discharge today from my perspective.  - Surgical cultures pending, NG at <12 hrs, Rare GPCs in pairs on gram stain. - Will plan for discharge on 10 days course of Augmentin 875-125 BID.  -WB Status: As tolerated to left foot in post op shoe -Sutures: Remained intact at todays dressing change. The sterile packing material was pulled from the central  incision without issue. -Medications: Will need Rx for oral Abx as above on DC.  -Foot redressed with betadine adaptic. -Follow up with me early next week Monday or Tuesday for dressing change. Until then keep dressing and foot clean dry intact.   Levester Fresh DPM

## 2021-10-01 NOTE — Progress Notes (Signed)
Provided discharge education/instructions, all questions and concerns addressed. Pt not in acute distress, discharged home with belongings accompanied by family. 

## 2021-10-04 LAB — AEROBIC/ANAEROBIC CULTURE W GRAM STAIN (SURGICAL/DEEP WOUND)

## 2021-10-04 NOTE — Progress Notes (Unsigned)
  Subjective:  Patient ID: Joel Kline, male    DOB: 24-Apr-1955,  MRN: [id number]  No chief complaint on file.   DOS: *** Procedure: ***  66 y.o. male returns for post-op check. ***  Review of Systems: Negative except as noted in the HPI. Denies N/V/F/Ch.   Objective:  There were no vitals filed for this visit. There is no height or weight on file to calculate BMI. Constitutional Well developed. Well nourished.  Vascular Foot warm and well perfused. Capillary refill normal to all digits.  Calf is soft and supple, no posterior calf or knee pain, negative Homans' sign  Neurologic Normal speech. Oriented to person, place, and time. Epicritic sensation to light touch grossly present bilaterally.  Dermatologic Skin healing well without signs of infection. Skin edges well coapted without signs of infection.  Orthopedic: Tenderness to palpation noted about the surgical site.   Multiple view plain film radiographs: *** Assessment:   1. Cellulitis and abscess of toe of left foot    Plan:  Patient was evaluated and treated and all questions answered.  S/p foot surgery {Left/right:33004} -Progressing as expected post-operatively. -XR: *** -WB Status: *** in *** -Sutures: ***. -Medications: *** -Foot redressed.  No follow-ups on file.

## 2021-10-05 ENCOUNTER — Ambulatory Visit (INDEPENDENT_AMBULATORY_CARE_PROVIDER_SITE_OTHER): Payer: Self-pay | Admitting: Podiatry

## 2021-10-05 DIAGNOSIS — L02612 Cutaneous abscess of left foot: Secondary | ICD-10-CM

## 2021-10-05 DIAGNOSIS — L03116 Cellulitis of left lower limb: Secondary | ICD-10-CM

## 2021-10-05 LAB — CULTURE, BLOOD (ROUTINE X 2)
Culture: NO GROWTH
Culture: NO GROWTH
Special Requests: ADEQUATE
Special Requests: ADEQUATE

## 2021-10-22 ENCOUNTER — Ambulatory Visit (INDEPENDENT_AMBULATORY_CARE_PROVIDER_SITE_OTHER): Payer: Self-pay | Admitting: Podiatry

## 2021-10-22 DIAGNOSIS — L02612 Cutaneous abscess of left foot: Secondary | ICD-10-CM

## 2021-10-22 DIAGNOSIS — L03116 Cellulitis of left lower limb: Secondary | ICD-10-CM

## 2021-10-22 NOTE — Progress Notes (Signed)
  Subjective:  Patient ID: Joel Kline, male    DOB: Jun 11, 1955,  MRN: [id number]  Chief Complaint  Patient presents with   Routine Post Op    F/U ON  INCISION AND DRAINAGE OF LEFT FOOT ABSCESS    DOS: 09/30/21  Procedure: Incision and drainage of abscess left foot  66 y.o. male returns for post-op check.  Patient presents for second postoperative check status post incision and drainage of abscess left dorsal lateral forefoot.  This was done last week Wednesday, September 30, 2021.  He reports improvement in the surgical site with decreased redness/purple discoloration decrease drainage.  He has been treating with Betadine and Band-Aid adhesive dressing daily.  Notes pain is continuing to decrease and not really bothering him at all at this point time he is getting back to wearing regular shoes.  Review of Systems: Negative except as noted in the HPI. Denies N/V/F/Ch.   Objective:  There were no vitals filed for this visit. There is no height or weight on file to calculate BMI. Constitutional Well developed. Well nourished.  Vascular Foot warm and well perfused. Capillary refill normal to all digits.  Calf is soft and supple, no posterior calf or knee pain, negative Homans' sign  Neurologic Normal speech. Oriented to person, place, and time. Epicritic sensation to light touch grossly present bilaterally.  Dermatologic Skin healing well without signs of infection. Skin edges well coapted without signs of infection.  No drainage no probing wound no erythema improving from prior visit  Orthopedic: Decreased tenderness to palpation noted about the surgical site.   Multiple view plain film radiographs: Deferred at this visit     Assessment:   1. Cellulitis of left foot   2. Abscess of left foot      Plan:  Patient was evaluated and treated and all questions answered.  S/p foot surgery left -Progressing as expected post-operatively.  No signs of recurrent abscess formation or  residual infection in the soft tissue.  Nearly fully healed at this time -XR: Deferred -WB Status: Weightbearing in regular shoe gear at this time -Sutures: Removed at this visit -Medications: No further antibiotics indicated no signs of infection -Foot redressed with Betadine adhesive bandage.  Patient shoe changes daily.  He is okay to get the area wet in the shower but should dry it well and reapply dressing after.  Monitor for any redness drainage swelling or nausea vomiting fever chills. -We will have him back for 1 final check in 1 month to make sure he is fully healed and no longer having issues with recurrent abscess in this area.  Return in about 4 weeks (around 11/19/2021) for f/u left foot I&D.

## 2021-11-16 ENCOUNTER — Ambulatory Visit: Payer: Self-pay | Admitting: Podiatry

## 2021-11-16 DIAGNOSIS — L02612 Cutaneous abscess of left foot: Secondary | ICD-10-CM

## 2021-11-16 DIAGNOSIS — L03116 Cellulitis of left lower limb: Secondary | ICD-10-CM

## 2021-11-16 NOTE — Progress Notes (Signed)
  Subjective:  Patient ID: Joel Kline, male    DOB: [birth year],  MRN: [id number]  Chief Complaint  Patient presents with   Follow-up    Left foot follow up, painful, possible infection    66 y.o. male presents for follow up of left foot prior abscess and cellulitis s/p Left foot I&D of dorsolateral foot abscess in the distal 4th interspace.  Patient reports that he has been doing well since last visit 4 weeks ago.  He says that he is not having too much pain but he does still have a small scab and occasionally has a tiny drop of drainage from this area.  He notices that happen more when he wears a certain pair of work boots versus his normal walking shoes.  He denies any redness or swelling at the time.  Denies any nausea vomiting fever chills.  Past Medical History:  Diagnosis Date   History of kidney stones    Hx of lithotripsy 1999    No Known Allergies  ROS: Negative except as per HPI above  Objective:  General: AAO x3, NAD  Dermatological: Attention directed to the dorsal lateral aspect of the left forefoot in the fourth interspace there is a small eschar present.  There is no erythema drainage or significant edema at the location.     Vascular:  Dorsalis Pedis artery and Posterior Tibial artery pedal pulses are 2/4 bilateral.  Capillary fill time < 3 sec to all digits.   Neruologic: Grossly intact via light touch bilateral. Protective threshold intact to all sites bilateral.   Musculoskeletal: No gross boney pedal deformities bilateral. No pain, crepitus, or limitation noted with foot and ankle range of motion bilateral. Muscular strength 5/5 in all groups tested bilateral.  Gait: Unassisted, Nonantalgic.   No images are attached to the encounter.  Assessment:   1. Cellulitis of left foot   2. Abscess of left foot      Plan:  Patient was evaluated and treated and all questions answered.  #Left dorsal lateral forefoot abscess and cellulitis status post I&D,  September 30, 2021, nearly completely healed -Discussed with the patient that overall I believe he is healing well after this I&D that was performed in early September.  Left foot appears without infection at this point time. -The incision and drainage site is continuing to heal improved from prior visit. -Recommend continued Betadine applied to the small eschar/wound present at the I&D site and cover with a bandage daily. -Discussed with the patient that unless he has worsening of the wound or redness swelling or drainage she does not need to return for follow-up he will follow-up as needed if any of these occur.  Return if symptoms worsen or fail to improve.          Everitt Amber, DPM [facility]

## 2022-02-19 ENCOUNTER — Ambulatory Visit (INDEPENDENT_AMBULATORY_CARE_PROVIDER_SITE_OTHER): Payer: Self-pay | Admitting: Podiatry

## 2022-02-19 DIAGNOSIS — L97522 Non-pressure chronic ulcer of other part of left foot with fat layer exposed: Secondary | ICD-10-CM

## 2022-02-19 DIAGNOSIS — L02612 Cutaneous abscess of left foot: Secondary | ICD-10-CM

## 2022-02-19 DIAGNOSIS — L03032 Cellulitis of left toe: Secondary | ICD-10-CM

## 2022-02-19 NOTE — Progress Notes (Signed)
  Subjective:  Patient ID: Joel Kline, male    DOB: [birth year],  MRN: [id number]  Chief Complaint  Patient presents with   follow up    Pt states wound is not healing properly from surgery in September. He states he still has bloody drainage     67 y.o. male presents for concern for small opening at site of prior incision and drainage area on the left dorsal lateral forefoot.  He says there is a small little area of eschar that sometimes drains blood drainage.  Past Medical History:  Diagnosis Date   History of kidney stones    Hx of lithotripsy 1999    No Known Allergies  ROS: Negative except as per HPI above  Objective:  General: AAO x3, NAD  Dermatological: Attention directed to the dorsal lateral forefoot on the left foot there is a very small area of eschar at the distal aspect of the incision and drainage incision from prior.  Small sinus tract that does not probe deep but limited to subcutaneous fat tissue no active drainage or purulence expressed.   Vascular:  Dorsalis Pedis artery and Posterior Tibial artery pedal pulses are 2/4 bilateral.  Capillary fill time < 3 sec to all digits.   Neruologic: Grossly intact via light touch bilateral. Protective threshold intact to all sites bilateral.   Musculoskeletal: No gross boney pedal deformities bilateral. No pain, crepitus, or limitation noted with foot and ankle range of motion bilateral. Muscular strength 5/5 in all groups tested bilateral.  Gait: Unassisted, Nonantalgic.   No images are attached to the encounter.  Radiographs:  Deferred at this visit Assessment:   1. Ulcer of left foot with fat layer exposed ([place])   2. Abscess of left foot   3. Cellulitis and abscess of toe of left foot      Plan:  Patient was evaluated and treated and all questions answered.  # Small ulceration at the distal aspect of prior incision and drainage site pinpoint in nature.  Possible small sinus tract -Discussed with the  patient that overall there are no signs of infection -There is a very tiny pinpoint ulceration but no active drainage or purulence. -Recommend continued wound care with Betadine and Band-Aid. -Hopeful that will continue to heal and close up I used a silver nitrate stick to trying chemically cauterized the small ulceration at this visit. -Patient instructed left to small wound grows larger or he sees redness or drainage worsen to call and come back in sooner otherwise he should follow-up as needed  Return if symptoms worsen or fail to improve.          Everitt Amber, DPM [facility] [place] / [facility]

## 2022-03-08 ENCOUNTER — Ambulatory Visit (INDEPENDENT_AMBULATORY_CARE_PROVIDER_SITE_OTHER): Payer: Self-pay | Admitting: Podiatry

## 2022-03-08 ENCOUNTER — Encounter: Payer: Self-pay | Admitting: Podiatry

## 2022-03-08 VITALS — BP 133/83 | HR 61

## 2022-03-08 DIAGNOSIS — L97522 Non-pressure chronic ulcer of other part of left foot with fat layer exposed: Secondary | ICD-10-CM

## 2022-03-08 DIAGNOSIS — L03032 Cellulitis of left toe: Secondary | ICD-10-CM

## 2022-03-08 DIAGNOSIS — L02612 Cutaneous abscess of left foot: Secondary | ICD-10-CM

## 2022-03-08 NOTE — Progress Notes (Signed)
  Subjective:  Patient ID: Joel Kline, male    DOB: 1955-02-19,  MRN: [id number]  Chief Complaint  Patient presents with   Wound Check    "It's not healed.  A little bit of drainage comes out.  It was red last Wednesday.  It was very painful."    67 y.o. male presents for concern for opening at site of prior incision and drainage area on the left dorsal lateral forefoot, states there had been some drainage that occurred last week but over the weekend the redness and drainage stopped.  The area is scabbed up and has not been looking the best it has since prior I&D.  He just wanted to get it checked out today to make sure nothing else needed to be done.  Denies any nausea vomiting fever chills.  Past Medical History:  Diagnosis Date   History of kidney stones    Hx of lithotripsy 1999    No Known Allergies  ROS: Negative except as per HPI above  Objective:  General: AAO x3, NAD  Dermatological: Attention directed to the dorsal lateral forefoot on the left foot there is a very small area of eschar at the distal aspect of the incision and drainage incision from prior.  No sign distress improved from prior no erythema no drainage.    Vascular:  Dorsalis Pedis artery and Posterior Tibial artery pedal pulses are 2/4 bilateral.  Capillary fill time < 3 sec to all digits.   Neruologic: Grossly intact via light touch bilateral. Protective threshold intact to all sites bilateral.   Musculoskeletal: No gross boney pedal deformities bilateral. No pain, crepitus, or limitation noted with foot and ankle range of motion bilateral. Muscular strength 5/5 in all groups tested bilateral.  Gait: Unassisted, Nonantalgic.   No images are attached to the encounter.  Radiographs:  Deferred at this visit Assessment:   1. Ulcer of left foot with fat layer exposed ([place])   2. Cellulitis and abscess of toe of left foot       Plan:  Patient was evaluated and treated and all questions  answered.  # Eschar at site of prior ulceration to dorsal lateral aspect of the left forefoot at site of prior I&D.  Improved from prior -Overall the area looks to have improved healed from prior. -No erythema drainage or open wound at this time -Continue bandaging with adhesive bandage over the area to protect it but try to leave the scab on irritated as possible. -If there is any further issue would need to consider imaging including x-ray and possible MRI for chronic osteomyelitis and draining sinus tract if that reoccurs. -Discussed that if the patient sees increasing drainage or redness then patient to follow-up.  Return if symptoms worsen or fail to improve.          Everitt Amber, DPM [facility] [place] / [facility]

## 2023-06-30 DIAGNOSIS — M4722 Other spondylosis with radiculopathy, cervical region: Secondary | ICD-10-CM | POA: Diagnosis not present

## 2023-06-30 DIAGNOSIS — M5388 Other specified dorsopathies, sacral and sacrococcygeal region: Secondary | ICD-10-CM | POA: Diagnosis not present

## 2023-06-30 DIAGNOSIS — M9904 Segmental and somatic dysfunction of sacral region: Secondary | ICD-10-CM | POA: Diagnosis not present

## 2023-06-30 DIAGNOSIS — M9901 Segmental and somatic dysfunction of cervical region: Secondary | ICD-10-CM | POA: Diagnosis not present

## 2023-06-30 DIAGNOSIS — M47816 Spondylosis without myelopathy or radiculopathy, lumbar region: Secondary | ICD-10-CM | POA: Diagnosis not present

## 2023-06-30 DIAGNOSIS — M9903 Segmental and somatic dysfunction of lumbar region: Secondary | ICD-10-CM | POA: Diagnosis not present

## 2023-07-07 DIAGNOSIS — I10 Essential (primary) hypertension: Secondary | ICD-10-CM | POA: Diagnosis not present

## 2023-07-07 DIAGNOSIS — N179 Acute kidney failure, unspecified: Secondary | ICD-10-CM | POA: Diagnosis not present

## 2023-07-07 DIAGNOSIS — M9903 Segmental and somatic dysfunction of lumbar region: Secondary | ICD-10-CM | POA: Diagnosis not present

## 2023-07-07 DIAGNOSIS — M47816 Spondylosis without myelopathy or radiculopathy, lumbar region: Secondary | ICD-10-CM | POA: Diagnosis not present

## 2023-07-07 DIAGNOSIS — M4722 Other spondylosis with radiculopathy, cervical region: Secondary | ICD-10-CM | POA: Diagnosis not present

## 2023-07-07 DIAGNOSIS — M9901 Segmental and somatic dysfunction of cervical region: Secondary | ICD-10-CM | POA: Diagnosis not present

## 2023-07-07 DIAGNOSIS — M5388 Other specified dorsopathies, sacral and sacrococcygeal region: Secondary | ICD-10-CM | POA: Diagnosis not present

## 2023-07-07 DIAGNOSIS — R972 Elevated prostate specific antigen [PSA]: Secondary | ICD-10-CM | POA: Diagnosis not present

## 2023-07-07 DIAGNOSIS — M9904 Segmental and somatic dysfunction of sacral region: Secondary | ICD-10-CM | POA: Diagnosis not present

## 2023-07-07 DIAGNOSIS — R7303 Prediabetes: Secondary | ICD-10-CM | POA: Diagnosis not present

## 2023-07-12 ENCOUNTER — Other Ambulatory Visit: Payer: Self-pay

## 2023-07-12 ENCOUNTER — Ambulatory Visit: Payer: Self-pay

## 2023-07-12 ENCOUNTER — Telehealth: Payer: Self-pay

## 2023-07-12 VITALS — BP 132/86 | HR 57 | Ht 70.63 in | Wt 177.0 lb

## 2023-07-12 DIAGNOSIS — R972 Elevated prostate specific antigen [PSA]: Secondary | ICD-10-CM | POA: Insufficient documentation

## 2023-07-12 DIAGNOSIS — R079 Chest pain, unspecified: Secondary | ICD-10-CM

## 2023-07-12 DIAGNOSIS — Z01810 Encounter for preprocedural cardiovascular examination: Secondary | ICD-10-CM | POA: Insufficient documentation

## 2023-07-12 DIAGNOSIS — R7303 Prediabetes: Secondary | ICD-10-CM | POA: Insufficient documentation

## 2023-07-12 DIAGNOSIS — I10 Essential (primary) hypertension: Secondary | ICD-10-CM

## 2023-07-12 DIAGNOSIS — N189 Chronic kidney disease, unspecified: Secondary | ICD-10-CM | POA: Diagnosis not present

## 2023-07-12 DIAGNOSIS — N179 Acute kidney failure, unspecified: Secondary | ICD-10-CM | POA: Diagnosis not present

## 2023-07-12 DIAGNOSIS — N1330 Unspecified hydronephrosis: Secondary | ICD-10-CM | POA: Diagnosis not present

## 2023-07-12 DIAGNOSIS — N281 Cyst of kidney, acquired: Secondary | ICD-10-CM | POA: Diagnosis not present

## 2023-07-12 DIAGNOSIS — Z87442 Personal history of urinary calculi: Secondary | ICD-10-CM | POA: Insufficient documentation

## 2023-07-12 NOTE — Telephone Encounter (Signed)
 Pre-op team,  Patient will need cardiac PET stress for further risk stratification per Dr. Maddireddy's note from today. This has not yet been scheduled.   If you can please up date requesting office.   Thank you!  Lonell Rives. Ruqayya Ventress, DNP, NP-C  07/12/2023, 4:54 PM [facility] [address] Office [phone] Fax [phone]

## 2023-07-12 NOTE — Progress Notes (Addendum)
 Cardiology Consultation:    Date:  07/12/2023   ID:  Joel Kline, DOB [birth year], MRN [id number]  PCP:  Joel Lauraine DASEN, NP  Cardiologist:  Joel SAUNDERS Ula Couvillon, MD   Referring MD: Joel Lauraine DASEN, NP   No chief complaint on file.    ASSESSMENT AND PLAN:   Ms. Joel Kline  68 year old male history of chronic dizziness, now diagnosed with hypertension and has been started on treatment, CKD stage IV [new diagnosis of abnormal BUN and creatinine, unclear if CKD versus acute kidney injury], prediabetes, osteoarthritis of shoulders and knees, moderate alcohol consumption [1-3 beers 5-6 times a week]. Denies any prior history of CAD, CHF, MI, CVA.  Seen today for blood pressure management and preop cardiovascular risk assessment prior to elective left shoulder surgery.  Problem List Items Addressed This Visit     Essential hypertension   Blood pressure controlled. Target blood pressure below 130/80 mmHg. Continue amlodipine 10 mg once daily.  Advised him to abstain or moderate alcohol as this can contribute to elevated blood pressures too.  Will also obtain transthoracic echocardiogram for cardiac structure and function assessment in the setting of longstanding history of hypertension, progressive symptoms of effort intolerance.      Relevant Orders   ECHOCARDIOGRAM COMPLETE   Preop cardiovascular exam - Primary   He has nonspecific EKG changes with T wave inversions in leads III and aVF. Does not have any specific signs or symptoms of heart failure but has atypical symptoms of progressive shortness of breath and atypical chest pain symptoms.  He does have good functional capacity but cannot entirely exclude any significant underlying cardiac issues given risk factors and findings on EKG and his symptoms as discussed above.    I would recommend further restratification with cardiac PET stress test to rule out any significant ischemia burden. If no significant high risk findings,  can proceed with his elective surgery as being planned.       Relevant Orders   EKG 12-Lead (Completed)   NM PET CT CARDIAC PERFUSION MULTI W/ABSOLUTE BLOODFLOW   ECHOCARDIOGRAM COMPLETE   Other Visit Diagnoses       Chest pain, unspecified type       Relevant Orders   NM PET CT CARDIAC PERFUSION MULTI W/ABSOLUTE BLOODFLOW       Recommended keep close follow-up with PCP with regards to his renal function assessment and follow-up. Follow-up with us  in the office tentatively in 2 months to review the test results.  Addendum 08/08/2023: - Cardiac PET stress 07/26/2023 noted no ischemia.  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 - Aortic atherosclerosis observed - Ascending aorta mildly dilated 43 mm on echocardiogram and appears similar on cardiac PET stress test.  - From cardiac standpoint okay to proceed with his elective surgery as being planned. -However decision regarding timing of the surgery and whether to proceed with surgery or first further evaluate the tumor observed in the abdomen will have to be made by the patient, PCP and surgeon.   History of Present Illness:    Joel Kline is a 68 y.o. male who is being seen today for the evaluation of hypertension and preop cardiovascular risk assessment prior to elective left shoulder surgery tentatively to be scheduled with Dr. Melita at [facility]. Here at the request of his  PCP Joel Lauraine DASEN, NP.  Very pleasant gentleman lives with his girlfriend at home.  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  Keeps up with his activities of daily living and work without significant limitations, does not exercise routinely.  Has not had regular healthcare visits for over 20  years and recently establish care with PCP about a month ago.  Gives history of chronic dizziness, now diagnosed with hypertension and has been started on treatment, CKD stage IV, prediabetes, osteoarthritis of shoulders and knees, moderate alcohol consumption [1-3 beers 5-6 times a week]. Denies any prior history of CAD, CHF, MI, CVA.  Mentions he has had longstanding history of dizziness which can occur randomly at times standing, sitting or even laying down.  Most noticeably this occurs when he has been standing for extended duration.  Denies any syncopal or near syncopal episodes.  Mentions he has been noticing symptoms of getting short of breath with activity to be progressing over the past couple years.  Associated with sense of discomfort in the chest.  Not a limiting issue from his functional standpoint.  Denies smoking. Alcohol consumption, moderate intake 1-3 beers 5-6 times a week. No illicit drug use.  Medications he was taking ibuprofen as needed previously.  PCP had him discontinue this and switch to Tylenol .  He however mentions he was only using ibuprofen/meloxicam occasionally and not on a regular basis. He has been started on amlodipine and currently on dose 10 mg once daily for blood pressure control which seems to have improved.  Mentions he had renal ultrasound this morning and is pending follow-up visit with urologist.  EKG in the clinic today shows sinus rhythm heart rate 57/min, PR interval 144 ms, QRS duration 106 ms, nonspecific T wave inversions in lead III and aVF  Blood work from 07/07/2023 noted BUN 29, creatinine 2.5, EGFR 28 Normal transaminases and alkaline phosphatase Sodium 141, potassium 4 Hemoglobin 14.5, hematocrit 41, platelets 209, WBC 10.7.  In comparison to prior blood work from 06-10-2023 noted BUN 26 and creatinine 2.3 with EGFR 31.   Past Medical History:  Diagnosis Date   AKI (acute kidney injury) (HCC)    Benign essential hypertension     Cellulitis of left foot 09/29/2021   Elevated PSA    History of kidney stones    Hx of lithotripsy 1999   Pre-diabetes     Past Surgical History:  Procedure Laterality Date   FOOT SURGERY  2023   INCISION AND DRAINAGE Left 09/30/2021   Procedure: INCISION AND DRAINAGE OF LEFT FOOT ABSCESS;  Surgeon: Malvin Marsa FALCON, DPM;  Location: [facility];  Service: Podiatry;  Laterality: Left;    Current Medications: Current Meds  Medication Sig   amLODipine (NORVASC) 10 MG tablet Take 10 mg by mouth daily.   baclofen (LIORESAL) 10 MG tablet Take 10 mg by mouth as needed for muscle spasms.   cloNIDine (CATAPRES) 0.1 MG tablet Take 0.1 mg by mouth every 12 (twelve) hours as needed (SBP > 170 and DBP >110).   MAGNESIUM Kline Take 1 tablet by mouth daily.   meloxicam (MOBIC) 15 MG tablet Take 15 mg by mouth daily as needed for pain.     Allergies:   Patient has no known allergies.   Social History   Socioeconomic History   Marital status: Divorced    Spouse name: Not on file   Number of children: Not on file   Years of education:  Not on file   Highest education level: Not on file  Occupational History   Not on file  Tobacco Use   Smoking status: Never   Smokeless tobacco: Never  Vaping Use   Vaping status: Never Used  Substance and Sexual Activity   Alcohol use: Not Currently    Comment: Rare   Drug use: Not Currently    Types: Marijuana    Comment: not recent   Sexual activity: Not Currently  Other Topics Concern   Not on file  Social History Narrative   Not on file   Social Drivers of Health   Financial Resource Strain: Not on file  Food Insecurity: No Food Insecurity (09/29/2021)   Hunger Vital Sign    Worried About Running Out of Food in the Last Year: Never true    Ran Out of Food in the Last Year: Never true  Transportation Needs: No Transportation Needs (09/29/2021)   PRAPARE - Administrator, Civil Service (Medical): No    Lack of Transportation  (Non-Medical): No  Physical Activity: Not on file  Stress: Not on file  Social Connections: Not on file     Family History: The patient's family history includes Colon cancer in his maternal grandmother; Ovarian cancer in his mother. ROS:   Please see the history of present illness.    All 14 point review of systems negative except as described per history of present illness.  EKGs/Labs/Other Studies Reviewed:    The following studies were reviewed today:   EKG:  EKG Interpretation Date/Time:  Tuesday July 12 2023 15:34:23 EDT Ventricular Rate:  57 PR Interval:  144 QRS Duration:  106 QT Interval:  404 QTC Calculation: 393 R Axis:   58  Text Interpretation: Sinus bradycardia t wave inversion in leads 3 and aVF Abnormal ECG No previous ECGs available Confirmed by Liborio Hai reddy [phone]) on 07/12/2023 3:54:27 PM    Recent Labs: No results found for requested labs within last 365 days.  Recent Lipid Panel No results found for: CHOL, TRIG, HDL, CHOLHDL, VLDL, LDLCALC, LDLDIRECT  Physical Exam:    VS:  BP 132/86   Pulse (!) 57   Ht 5' 10.63 (1.794 m)   Wt 177 lb (80.3 kg)   SpO2 97%   BMI 24.95 kg/m     Wt Readings from Last 3 Encounters:  07/12/23 177 lb (80.3 kg)  07/07/23 174 lb (78.9 kg)  09/29/21 185 lb (83.9 kg)     GENERAL:  Well nourished, well developed in no acute distress NECK: No JVD; No carotid bruits CARDIAC: RRR, S1 and S2 present, no murmurs, no rubs, no gallops CHEST:  Clear to auscultation without rales, wheezing or rhonchi  Extremities: No pitting pedal edema. Pulses bilaterally symmetric with radial 2+ and dorsalis pedis 2+ NEUROLOGIC:  Alert and oriented x 3  Medication Adjustments/Labs and Tests Ordered: Current medicines are reviewed at length with the patient today.  Concerns regarding medicines are outlined above.  Orders Placed This Encounter  Procedures   NM PET CT CARDIAC PERFUSION MULTI W/ABSOLUTE BLOODFLOW    EKG 12-Lead   ECHOCARDIOGRAM COMPLETE   No orders of the defined types were placed in this encounter.   Signed, Kayden Amend reddy Anagabriela Jokerst, MD, MPH, [facility] 07/12/2023 4:24 PM    [facility]

## 2023-07-12 NOTE — Telephone Encounter (Signed)
   Pre-operative Risk Assessment    Patient Name: Joel Kline  DOB: 01/12/1956 MRN: [id number]   Date of last office visit: None Date of next office visit: 07/12/2023   Request for Surgical Clearance    Procedure:  Left reverse shoulder arthroplasty   Date of Surgery:  Clearance TBD                                 Surgeon:  Dr. Ellard Gunning Surgeon's Group or Practice Name:  [facility] Phone number:  [phone] Fax number:  [phone]   Type of Clearance Requested:   - Medical  - Pharmacy:  Hold Not specified    Type of Anesthesia:  General    Additional requests/questions:  fax clearance status to surgeon office  Signed, Kaliann Coryell M [place] Gandolfo   07/12/2023, 9:42 AM

## 2023-07-12 NOTE — Patient Instructions (Addendum)
 Medication Instructions:  Your physician recommends that you continue on your current medications as directed. Please refer to the Current Medication list given to you today.  *If you need a refill on your cardiac medications before your next appointment, please call your pharmacy*   Lab Work: None ordered If you have labs (blood work) drawn today and your tests are completely normal, you will receive your results only by: MyChart Message (if you have MyChart) OR A paper copy in the mail If you have any lab test that is abnormal or we need to change your treatment, we will call you to review the results.  Testing/Procedures: Your physician has requested that you have an echocardiogram. Echocardiography is a painless test that uses sound waves to create images of your heart. It provides your doctor with information about the size and shape of your heart and how well your heart's chambers and valves are working. This procedure takes approximately one hour. There are no restrictions for this procedure. Please do NOT wear cologne, perfume, aftershave, or lotions (deodorant is allowed). Please arrive 15 minutes prior to your appointment time.  Please note: We ask at that you not bring children with you during ultrasound (echo/ vascular) testing. Due to room size and safety concerns, children are not allowed in the ultrasound rooms during exams. Our front office staff cannot provide observation of children in our lobby area while testing is being conducted. An adult accompanying a patient to their appointment will only be allowed in the ultrasound room at the discretion of the ultrasound technician under special circumstances. We apologize for any inconvenience.   How to Prepare for Your Cardiac PET/CT Stress Test:  1. Please do not take these medications before your test:   Medications that may interfere with the cardiac pharmacological stress agent (ex. nitrates - including erectile dysfunction  medications, isosorbide mononitrate- [please start to hold this medication the day before the test], tamulosin or beta-blockers) the day of the exam. (Erectile dysfunction medication should be held for at least 72 hrs prior to test) Theophylline containing medications for 12 hours. Dipyridamole 48 hours prior to the test. Your remaining medications may be taken with water.  2. Nothing to eat or drink, except water, 3 hours prior to arrival time.   NO caffeine/decaffeinated products, or chocolate 12 hours prior to arrival.  3. NO perfume, cologne or lotion on chest or abdomen area.        4. Total time is 1 to 2 hours; you may want to bring reading material for the waiting time.  5. Please report to Radiology at the [facility] Main Entrance 30 minutes early for your test.  [address] [place], [place] [address]  Diabetic Preparation:  Hold oral medications. You may take NPH and Lantus insulin. Do not take Humalog or Humulin R (Regular Insulin) the day of your test. Check blood sugars prior to leaving the house. If able to eat breakfast prior to 3 hour fasting, you may take all medications, including your insulin, Do not worry if you miss your breakfast dose of insulin - start at your next meal. Patients who wear a continuous glucose monitor MUST remove the device prior to scanning.   In preparation for your appointment, medication and supplies will be purchased.  Appointment availability is limited, so if you need to cancel or reschedule, please call the Radiology Department at [phone] Maryan Smalling) OR [phone] [facility])  24 hours in advance to avoid a cancellation fee  of $100.00  What to Expect After you Arrive:  Once you arrive and check in for your appointment, you will be taken to a preparation room within the Radiology Department.  A technologist or Nurse will obtain your medical history, verify that you are correctly prepped for the exam, and explain the  procedure.  Afterwards,  an IV will be started in your arm and electrodes will be placed on your skin for EKG monitoring during the stress portion of the exam. Then you will be escorted to the PET/CT scanner.  There, staff will get you positioned on the scanner and obtain a blood pressure and EKG.  During the exam, you will continue to be connected to the EKG and blood pressure machines.  A small, safe amount of a radioactive tracer will be injected in your IV to obtain a series of pictures of your heart along with an injection of a stress agent.    After your Exam:  It is recommended that you eat a meal and drink a caffeinated beverage to counter act any effects of the stress agent.  Drink plenty of fluids for the remainder of the day and urinate frequently for the first couple of hours after the exam.  Your doctor will inform you of your test results within 7-10 business days.  For more information and frequently asked questions, please visit our website : [email or web address]  For questions about your test or how to prepare for your test, please call: Cardiac Imaging Nurse Navigators Office: [phone]    Follow-Up: At [facility], you and your health needs are our priority.  As part of our continuing mission to provide you with exceptional heart care, we have created designated Provider Care Teams.  These Care Teams include your primary Cardiologist (physician) and Advanced Practice Providers (APPs -  Physician Assistants and Nurse Practitioners) who all work together to provide you with the care you need, when you need it.  We recommend signing up for the patient portal called MyChart.  Sign up information is provided on this After Visit Summary.  MyChart is used to connect with patients for Virtual Visits (Telemedicine).  Patients are able to view lab/test results, encounter notes, upcoming appointments, etc.  Non-urgent messages can be sent to your provider as well.   To  learn more about what you can do with MyChart, go to [email or web address].    Your next appointment:   2 month(s)  The format for your next appointment:   In Person  Provider:   Bertha Broad, MD   Other Instructions Nuclear Medicine Exam:  What to Expect A nuclear medicine exam is a safe and painless imaging test. It can help your health care provider find diseases. It also shows how your organs work and how they're structured. For the exam, you'll be given a radioactive substance called a tracer. This will be absorbed by your body's organs. The tracer will show up when the scanner takes pictures of your body. There are a few kinds of this exam. They include: A CT scan. An MRI. A PET scan. A SPECT scan. Tell your health care provider about: Any allergies you have. All medicines you take. These include vitamins, herbs, eye drops, and creams. Any problems you or family members have had with anesthesia. Any bleeding problems you have. Any surgeries you've had. Any medical conditions you have. Whether you're pregnant, may be pregnant, or are breastfeeding. What are the risks? Your provider will talk with you about risks. These may  include: Exposure to a small amount of radiation. An allergic reaction to the tracer. This is rare. What happens before the exam? Medicines Ask about changing or stopping: Any medicines you take. Any vitamins, herbs, or supplements you take. Do not take aspirin or ibuprofen unless you're told to. General instructions Eat and drink as told. Do not wear jewelry. Wear loose, comfortable clothes. You may be asked to wear a hospital gown for the exam. Bring past imaging studies with you if you have them. These include X-rays. What happens during the exam?  An IV will be put into a vein in your hand or arm. You'll be asked to lie on a table or sit in a chair. You'll be given the tracer. It may be given as: A pill or liquid to swallow. A  shot. Medicine through your IV. A gas to breathe in. A large scanning machine will be used to make pictures of your body. After the pictures are taken, you may have to wait until your provider can make sure that enough pictures were taken. These steps may vary. Ask what you can expect. What can I expect after the exam? Ask when your exam results will be ready and how to get them. You may need to call or meet with your provider to get your results. Also ask: What are my treatment options? What other tests do I need? What are my next steps? Follow these instructions at home: Drink more fluids as told. You may need to drink 6-8 glasses of water. This helps to remove the tracer from your body. Ask what things are safe for you to do at home. Ask when you can go back to work or school. Get help right away if: You have trouble breathing. This symptom may be an emergency. Call 911 right away. Do not wait to see if the symptom will go away. Do not drive yourself to the hospital. This information is not intended to replace advice given to you by your health care provider. Make sure you discuss any questions you have with your health care provider. Document Revised: 07/28/2022 Document Reviewed: 07/28/2022 Elsevier Patient Education  2024 Elsevier Inc.  Echocardiogram An echocardiogram is a test that uses sound waves (ultrasound) to produce images of the heart. Images from an echocardiogram can provide important information about: Heart size and shape. The size and thickness and movement of your heart's walls. Heart muscle function and strength. Heart valve function or if you have stenosis. Stenosis is when the heart valves are too narrow. If blood is flowing backward through the heart valves (regurgitation). A tumor or infectious growth around the heart valves. Areas of heart muscle that are not working well because of poor blood flow or injury from a heart attack. Aneurysm detection. An  aneurysm is a weak or damaged part of an artery wall. The wall bulges out from the normal force of blood pumping through the body. Tell a health care provider about: Any allergies you have. All medicines you are taking, including vitamins, herbs, eye drops, creams, and over-the-counter medicines. Any blood disorders you have. Any surgeries you have had. Any medical conditions you have. Whether you are pregnant or may be pregnant. What are the risks? Generally, this is a safe test. However, problems may occur, including an allergic reaction to dye (contrast) that may be used during the test. What happens before the test? No specific preparation is needed. You may eat and drink normally. What happens during the test? You  will take off your clothes from the waist up and put on a hospital gown. Electrodes or electrocardiogram (ECG)patches may be placed on your chest. The electrodes or patches are then connected to a device that monitors your heart rate and rhythm. You will lie down on a table for an ultrasound exam. A gel will be applied to your chest to help sound waves pass through your skin. A handheld device, called a transducer, will be pressed against your chest and moved over your heart. The transducer produces sound waves that travel to your heart and bounce back (or echo back) to the transducer. These sound waves will be captured in real-time and changed into images of your heart that can be viewed on a video monitor. The images will be recorded on a computer and reviewed by your health care provider. You may be asked to change positions or hold your breath for a short time. This makes it easier to get different views or better views of your heart. In some cases, you may receive contrast through an IV in one of your veins. This can improve the quality of the pictures from your heart. The procedure may vary among health care providers and hospitals.   What can I expect after the test? You  may return to your normal, everyday life, including diet, activities, and medicines, unless your health care provider tells you not to do that. Follow these instructions at home: It is up to you to get the results of your test. Ask your health care provider, or the department that is doing the test, when your results will be ready. Keep all follow-up visits. This is important. Summary An echocardiogram is a test that uses sound waves (ultrasound) to produce images of the heart. Images from an echocardiogram can provide important information about the size and shape of your heart, heart muscle function, heart valve function, and other possible heart problems. You do not need to do anything to prepare before this test. You may eat and drink normally. After the echocardiogram is completed, you may return to your normal, everyday life, unless your health care provider tells you not to do that. This information is not intended to replace advice given to you by your health care provider. Make sure you discuss any questions you have with your health care provider. Document Revised: 09/04/2019 Document Reviewed: 09/04/2019 Elsevier Patient Education  2021 Elsevier Inc.   Important Information About Sugar

## 2023-07-12 NOTE — Assessment & Plan Note (Addendum)
 Blood pressure controlled. Target blood pressure below 130/80 mmHg. Continue amlodipine 10 mg once daily.  Advised him to abstain or moderate alcohol as this can contribute to elevated blood pressures too.  Will also obtain transthoracic echocardiogram for cardiac structure and function assessment in the setting of longstanding history of hypertension, progressive symptoms of effort intolerance.

## 2023-07-12 NOTE — Assessment & Plan Note (Signed)
 He has nonspecific EKG changes with T wave inversions in leads III and aVF. Does not have any specific signs or symptoms of heart failure but has atypical symptoms of progressive shortness of breath and atypical chest pain symptoms.  He does have good functional capacity but cannot entirely exclude any significant underlying cardiac issues given risk factors and findings on EKG and his symptoms as discussed above.    I would recommend further restratification with cardiac PET stress test to rule out any significant ischemia burden. If no significant high risk findings, can proceed with his elective surgery as being planned.

## 2023-07-12 NOTE — Telephone Encounter (Signed)
 I will update the requesting office the pt will need a Cardiac PET CT scan before he can be cleared. I will also send a message to Dr. Maddireddy's nurse to schedule Cardiac PET CT per Dr. Maddireddy.

## 2023-07-14 DIAGNOSIS — M9903 Segmental and somatic dysfunction of lumbar region: Secondary | ICD-10-CM | POA: Diagnosis not present

## 2023-07-14 DIAGNOSIS — M4722 Other spondylosis with radiculopathy, cervical region: Secondary | ICD-10-CM | POA: Diagnosis not present

## 2023-07-14 DIAGNOSIS — M5388 Other specified dorsopathies, sacral and sacrococcygeal region: Secondary | ICD-10-CM | POA: Diagnosis not present

## 2023-07-14 DIAGNOSIS — M9904 Segmental and somatic dysfunction of sacral region: Secondary | ICD-10-CM | POA: Diagnosis not present

## 2023-07-14 DIAGNOSIS — M47816 Spondylosis without myelopathy or radiculopathy, lumbar region: Secondary | ICD-10-CM | POA: Diagnosis not present

## 2023-07-14 DIAGNOSIS — M9901 Segmental and somatic dysfunction of cervical region: Secondary | ICD-10-CM | POA: Diagnosis not present

## 2023-07-15 DIAGNOSIS — R338 Other retention of urine: Secondary | ICD-10-CM | POA: Diagnosis not present

## 2023-07-15 DIAGNOSIS — N130 Hydronephrosis with ureteropelvic junction obstruction: Secondary | ICD-10-CM | POA: Diagnosis not present

## 2023-07-15 NOTE — Telephone Encounter (Addendum)
 Pt has PET CT Stress test 07/26/23, echo 08/05/23 and follow up with Dr. Ronell Coe 8/22 for 2 month follow up.    I will update surgeon office.

## 2023-07-21 DIAGNOSIS — M4722 Other spondylosis with radiculopathy, cervical region: Secondary | ICD-10-CM | POA: Diagnosis not present

## 2023-07-21 DIAGNOSIS — M5388 Other specified dorsopathies, sacral and sacrococcygeal region: Secondary | ICD-10-CM | POA: Diagnosis not present

## 2023-07-21 DIAGNOSIS — M9903 Segmental and somatic dysfunction of lumbar region: Secondary | ICD-10-CM | POA: Diagnosis not present

## 2023-07-21 DIAGNOSIS — M9904 Segmental and somatic dysfunction of sacral region: Secondary | ICD-10-CM | POA: Diagnosis not present

## 2023-07-21 DIAGNOSIS — M47816 Spondylosis without myelopathy or radiculopathy, lumbar region: Secondary | ICD-10-CM | POA: Diagnosis not present

## 2023-07-21 DIAGNOSIS — M9901 Segmental and somatic dysfunction of cervical region: Secondary | ICD-10-CM | POA: Diagnosis not present

## 2023-07-22 ENCOUNTER — Encounter (HOSPITAL_COMMUNITY): Payer: Self-pay

## 2023-07-26 ENCOUNTER — Encounter (HOSPITAL_COMMUNITY)
Admission: RE | Admit: 2023-07-26 | Discharge: 2023-07-26 | Disposition: A | Discharge status: Home / Self Care | Source: Ambulatory Visit

## 2023-07-26 ENCOUNTER — Telehealth: Payer: Self-pay

## 2023-07-26 DIAGNOSIS — R079 Chest pain, unspecified: Secondary | ICD-10-CM | POA: Diagnosis not present

## 2023-07-26 DIAGNOSIS — Z01810 Encounter for preprocedural cardiovascular examination: Secondary | ICD-10-CM | POA: Diagnosis not present

## 2023-07-26 LAB — NM PET CT CARDIAC PERFUSION MULTI W/ABSOLUTE BLOODFLOW
LV dias vol: 128 mL (ref 62–150)
LV sys vol: 64 mL (ref 4.2–5.8)
MBFR: 2.17
Nuc Rest EF: 50 %
Nuc Stress EF: 57 %
Peak HR: 96 {beats}/min
Rest HR: 70 {beats}/min
Rest MBF: 0.69 ml/g/min
Rest Nuclear Isotope Dose: 20.5 mCi
ST Depression (mm): 0 mm
Stress MBF: 1.5 ml/g/min
Stress Nuclear Isotope Dose: 20.6 mCi

## 2023-07-26 MED ORDER — RUBIDIUM RB82 GENERATOR (RUBYFILL)
20.5500 | PACK | Freq: Once | INTRAVENOUS | Status: AC
  Administered 2023-07-26: 20.55 via INTRAVENOUS

## 2023-07-26 MED ORDER — REGADENOSON 0.4 MG/5ML IV SOLN
0.4000 mg | Freq: Once | INTRAVENOUS | Status: AC
  Administered 2023-07-26: 0.4 mg via INTRAVENOUS

## 2023-07-26 MED ORDER — RUBIDIUM RB82 GENERATOR (RUBYFILL)
20.5400 | PACK | Freq: Once | INTRAVENOUS | Status: AC
  Administered 2023-07-26: 20.54 via INTRAVENOUS

## 2023-07-26 NOTE — Telephone Encounter (Signed)
 Received a critical result call from [facility] in [place]. 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 Recommend further evaluation with abdominal MRI without and with contrast.  Spoke to Dr. Liborio regarding these findings and he stated to call the patient and inform him and then forward the results of the PET scan to the patient's PCP and have them follow up with their PCP for further testing and evaluation. The patient was called and informed of the finding and the results of the test were forwarded to the patient's PCP. Patient had no further questions at this time.

## 2023-07-26 NOTE — Telephone Encounter (Signed)
 Calling to report CT findings, call transferred.

## 2023-07-26 NOTE — Progress Notes (Signed)
 Pt. Tolerated lexi scan well.

## 2023-07-27 ENCOUNTER — Ambulatory Visit: Payer: Self-pay

## 2023-07-27 DIAGNOSIS — N130 Hydronephrosis with ureteropelvic junction obstruction: Secondary | ICD-10-CM | POA: Diagnosis not present

## 2023-07-27 DIAGNOSIS — R338 Other retention of urine: Secondary | ICD-10-CM | POA: Diagnosis not present

## 2023-07-28 DIAGNOSIS — M9902 Segmental and somatic dysfunction of thoracic region: Secondary | ICD-10-CM | POA: Diagnosis not present

## 2023-07-28 DIAGNOSIS — M47814 Spondylosis without myelopathy or radiculopathy, thoracic region: Secondary | ICD-10-CM | POA: Diagnosis not present

## 2023-07-28 DIAGNOSIS — M9901 Segmental and somatic dysfunction of cervical region: Secondary | ICD-10-CM | POA: Diagnosis not present

## 2023-07-28 DIAGNOSIS — M47812 Spondylosis without myelopathy or radiculopathy, cervical region: Secondary | ICD-10-CM | POA: Diagnosis not present

## 2023-07-28 DIAGNOSIS — M5459 Other low back pain: Secondary | ICD-10-CM | POA: Diagnosis not present

## 2023-07-28 DIAGNOSIS — M9903 Segmental and somatic dysfunction of lumbar region: Secondary | ICD-10-CM | POA: Diagnosis not present

## 2023-08-02 DIAGNOSIS — K3189 Other diseases of stomach and duodenum: Secondary | ICD-10-CM | POA: Diagnosis not present

## 2023-08-04 DIAGNOSIS — M47812 Spondylosis without myelopathy or radiculopathy, cervical region: Secondary | ICD-10-CM | POA: Diagnosis not present

## 2023-08-04 DIAGNOSIS — M9902 Segmental and somatic dysfunction of thoracic region: Secondary | ICD-10-CM | POA: Diagnosis not present

## 2023-08-04 DIAGNOSIS — M47814 Spondylosis without myelopathy or radiculopathy, thoracic region: Secondary | ICD-10-CM | POA: Diagnosis not present

## 2023-08-04 DIAGNOSIS — M5459 Other low back pain: Secondary | ICD-10-CM | POA: Diagnosis not present

## 2023-08-04 DIAGNOSIS — M9901 Segmental and somatic dysfunction of cervical region: Secondary | ICD-10-CM | POA: Diagnosis not present

## 2023-08-04 DIAGNOSIS — M9903 Segmental and somatic dysfunction of lumbar region: Secondary | ICD-10-CM | POA: Diagnosis not present

## 2023-08-05 ENCOUNTER — Ambulatory Visit

## 2023-08-05 DIAGNOSIS — Z01810 Encounter for preprocedural cardiovascular examination: Secondary | ICD-10-CM

## 2023-08-05 DIAGNOSIS — I10 Essential (primary) hypertension: Secondary | ICD-10-CM

## 2023-08-05 DIAGNOSIS — M12811 Other specific arthropathies, not elsewhere classified, right shoulder: Secondary | ICD-10-CM | POA: Insufficient documentation

## 2023-08-05 LAB — ECHOCARDIOGRAM COMPLETE
AR max vel: 1.92 cm2
AV Area VTI: 2.08 cm2
AV Area mean vel: 1.99 cm2
AV Mean grad: 4 mmHg
AV Peak grad: 8.4 mmHg
Ao pk vel: 1.45 m/s
Area-P 1/2: 2.47 cm2
MV M vel: 3.19 m/s
MV Peak grad: 40.7 mmHg
MV VTI: 1.46 cm2
S' Lateral: 3.1 cm

## 2023-08-08 NOTE — Telephone Encounter (Addendum)
   Patient Name: Joel Kline  DOB: 05/19/1955 MRN: [id number]  Primary Cardiologist:  Dr. Liborio  Chart reviewed as part of pre-operative protocol coverage. Patient was already seen 07/12/23 by Dr. Liborio for pre-op evaluation at which time echo and stress test were ordered. Cardiac PET scan 07/26/23 was normal but with coronary calcification present. 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  Per office protocol, the provider who saw the patient should forward their finalized clearance decision to requesting party below upon completion of testing. I will route this message to Dr. Liborio so they are aware of where to fax final recommendation to.  Will route this message to surgeon as FYI. Will remove this message from the pre-op box as separate preop APP input is not needed.  Medard Decuir N Samhita Kretsch, PA-C 08/08/2023, 8:52 AM

## 2023-08-10 NOTE — Telephone Encounter (Signed)
   Patient Name: Joel Kline  DOB: 1955-10-31 MRN: [id number]  Primary Cardiologist: Mallipeddi  Chart reviewed as part of pre-operative protocol coverage. Given past medical history and time since last visit, based on ACC/AHA guidelines, Joel Kline is at acceptable risk for the planned procedure without further cardiovascular testing.    Per Dr. Mallipeddi 08/09/2023 He had a mass in the abdomen identified on his cardiac PET stress from 07/26/2023.  We previously forwarded the note to his PCPs office. Please see if the PCP acknowledged this and have plans to further image the abdomen to assess the mass.   - From cardiac standpoint okay to proceed with his elective shoulder surgery surgery as being planned. - However, decision regarding timing of the surgery and whether to proceed with surgery or first further evaluate the mass observed in the abdomen will have to be made by the patient, PCP and surgeon.  The patient was advised that if he develops new symptoms prior to surgery to contact our office to arrange for a follow-up visit, and he verbalized understanding.  I will route this recommendation to the requesting party via Epic fax function and remove from pre-op pool.  Please call with questions.  [name], NP 08/10/2023, 7:34 AM

## 2023-08-11 DIAGNOSIS — M9903 Segmental and somatic dysfunction of lumbar region: Secondary | ICD-10-CM | POA: Diagnosis not present

## 2023-08-11 DIAGNOSIS — M47814 Spondylosis without myelopathy or radiculopathy, thoracic region: Secondary | ICD-10-CM | POA: Diagnosis not present

## 2023-08-11 DIAGNOSIS — M5459 Other low back pain: Secondary | ICD-10-CM | POA: Diagnosis not present

## 2023-08-11 DIAGNOSIS — M47812 Spondylosis without myelopathy or radiculopathy, cervical region: Secondary | ICD-10-CM | POA: Diagnosis not present

## 2023-08-11 DIAGNOSIS — M9902 Segmental and somatic dysfunction of thoracic region: Secondary | ICD-10-CM | POA: Diagnosis not present

## 2023-08-11 DIAGNOSIS — M9901 Segmental and somatic dysfunction of cervical region: Secondary | ICD-10-CM | POA: Diagnosis not present

## 2023-08-12 DIAGNOSIS — M1990 Unspecified osteoarthritis, unspecified site: Secondary | ICD-10-CM | POA: Diagnosis not present

## 2023-08-12 DIAGNOSIS — R339 Retention of urine, unspecified: Secondary | ICD-10-CM | POA: Diagnosis not present

## 2023-08-12 DIAGNOSIS — I10 Essential (primary) hypertension: Secondary | ICD-10-CM | POA: Diagnosis not present

## 2023-08-12 DIAGNOSIS — N1330 Unspecified hydronephrosis: Secondary | ICD-10-CM | POA: Diagnosis not present

## 2023-08-12 DIAGNOSIS — R7303 Prediabetes: Secondary | ICD-10-CM | POA: Diagnosis not present

## 2023-08-12 DIAGNOSIS — N189 Chronic kidney disease, unspecified: Secondary | ICD-10-CM | POA: Diagnosis not present

## 2023-08-12 DIAGNOSIS — K3189 Other diseases of stomach and duodenum: Secondary | ICD-10-CM | POA: Diagnosis not present

## 2023-08-12 DIAGNOSIS — N179 Acute kidney failure, unspecified: Secondary | ICD-10-CM | POA: Diagnosis not present

## 2023-08-18 DIAGNOSIS — M5459 Other low back pain: Secondary | ICD-10-CM | POA: Diagnosis not present

## 2023-08-18 DIAGNOSIS — M9903 Segmental and somatic dysfunction of lumbar region: Secondary | ICD-10-CM | POA: Diagnosis not present

## 2023-08-18 DIAGNOSIS — M47812 Spondylosis without myelopathy or radiculopathy, cervical region: Secondary | ICD-10-CM | POA: Diagnosis not present

## 2023-08-18 DIAGNOSIS — M47814 Spondylosis without myelopathy or radiculopathy, thoracic region: Secondary | ICD-10-CM | POA: Diagnosis not present

## 2023-08-18 DIAGNOSIS — M9902 Segmental and somatic dysfunction of thoracic region: Secondary | ICD-10-CM | POA: Diagnosis not present

## 2023-08-18 DIAGNOSIS — M9901 Segmental and somatic dysfunction of cervical region: Secondary | ICD-10-CM | POA: Diagnosis not present

## 2023-08-23 DIAGNOSIS — R338 Other retention of urine: Secondary | ICD-10-CM | POA: Diagnosis not present

## 2023-08-23 DIAGNOSIS — N130 Hydronephrosis with ureteropelvic junction obstruction: Secondary | ICD-10-CM | POA: Diagnosis not present

## 2023-08-25 DIAGNOSIS — M47812 Spondylosis without myelopathy or radiculopathy, cervical region: Secondary | ICD-10-CM | POA: Diagnosis not present

## 2023-08-25 DIAGNOSIS — M9901 Segmental and somatic dysfunction of cervical region: Secondary | ICD-10-CM | POA: Diagnosis not present

## 2023-08-25 DIAGNOSIS — M9903 Segmental and somatic dysfunction of lumbar region: Secondary | ICD-10-CM | POA: Diagnosis not present

## 2023-08-25 DIAGNOSIS — M9902 Segmental and somatic dysfunction of thoracic region: Secondary | ICD-10-CM | POA: Diagnosis not present

## 2023-08-25 DIAGNOSIS — M47814 Spondylosis without myelopathy or radiculopathy, thoracic region: Secondary | ICD-10-CM | POA: Diagnosis not present

## 2023-08-25 DIAGNOSIS — M5459 Other low back pain: Secondary | ICD-10-CM | POA: Diagnosis not present

## 2023-09-01 DIAGNOSIS — M9903 Segmental and somatic dysfunction of lumbar region: Secondary | ICD-10-CM | POA: Diagnosis not present

## 2023-09-01 DIAGNOSIS — M546 Pain in thoracic spine: Secondary | ICD-10-CM | POA: Diagnosis not present

## 2023-09-01 DIAGNOSIS — M9904 Segmental and somatic dysfunction of sacral region: Secondary | ICD-10-CM | POA: Diagnosis not present

## 2023-09-01 DIAGNOSIS — M5388 Other specified dorsopathies, sacral and sacrococcygeal region: Secondary | ICD-10-CM | POA: Diagnosis not present

## 2023-09-01 DIAGNOSIS — M9902 Segmental and somatic dysfunction of thoracic region: Secondary | ICD-10-CM | POA: Diagnosis not present

## 2023-09-01 DIAGNOSIS — M47897 Other spondylosis, lumbosacral region: Secondary | ICD-10-CM | POA: Diagnosis not present

## 2023-09-02 ENCOUNTER — Other Ambulatory Visit: Payer: Self-pay | Admitting: General Surgery

## 2023-09-02 ENCOUNTER — Telehealth: Payer: Self-pay

## 2023-09-02 DIAGNOSIS — R339 Retention of urine, unspecified: Secondary | ICD-10-CM | POA: Insufficient documentation

## 2023-09-02 DIAGNOSIS — Z800 Family history of malignant neoplasm of digestive organs: Secondary | ICD-10-CM | POA: Diagnosis not present

## 2023-09-02 DIAGNOSIS — R338 Other retention of urine: Secondary | ICD-10-CM | POA: Diagnosis not present

## 2023-09-02 DIAGNOSIS — Z8041 Family history of malignant neoplasm of ovary: Secondary | ICD-10-CM | POA: Diagnosis not present

## 2023-09-02 DIAGNOSIS — D490 Neoplasm of unspecified behavior of digestive system: Secondary | ICD-10-CM | POA: Diagnosis not present

## 2023-09-02 DIAGNOSIS — K3189 Other diseases of stomach and duodenum: Secondary | ICD-10-CM | POA: Diagnosis not present

## 2023-09-02 NOTE — Telephone Encounter (Signed)
 [facility] can look out for a fax from them for clearance. She states they sent it to [facility] fax queue.

## 2023-09-02 NOTE — Telephone Encounter (Signed)
 Dr. Liborio,  Mr. Critzer is requesting robotic partial gastrectomy.  Procedure has not yet been scheduled.  He was seen by you on 07/12/2023.  He ordered cardiac PET/CT and echocardiogram.  Results were reassuring.  His PMH also includes chronic dizziness, hypertension, CKD stage IV, and prediabetes.  Would he be acceptable risk for upcoming surgery?  Thank you for your help.  Please direct your response to [facility] preop pool.  Josefa HERO. Rylei Masella NP-C     09/02/2023, 3:13 PM [facility] [address] Office [phone] Fax [phone] '

## 2023-09-02 NOTE — Telephone Encounter (Signed)
   Pre-operative Risk Assessment    Patient Name: Joel Kline  DOB: Nov 21, 1955 MRN: [id number]   Date of last office visit: 07/12/23 Date of next office visit: 09/16/23   Request for Surgical Clearance    Procedure:  robotic partial gastrectomy  Date of Surgery:  Clearance TBD                                Surgeon:  [name], MD Surgeon's Group or Practice Name:  [facility]  Phone number:  [phone] Fax number:  [phone] Larraine Ellen, CMA   Type of Clearance Requested:   - Medical    Type of Anesthesia:  General    Additional requests/questions:    Bonney Annabella LITTIE Dorlene   09/02/2023, 2:37 PM

## 2023-09-05 NOTE — Telephone Encounter (Signed)
   Patient Name: Joel Kline  DOB: 27-May-1955 MRN: [id number]  Primary Cardiologist: None  Chart reviewed as part of pre-operative protocol coverage.   Per Dr. Liborio, From cardiac standpoint okay to proceed with any elective procedure and the partial gastrectomy being planned for the tumor. Remains at relatively low risk given his recent cardiac PET stress showing no significant ischemia.  Given past medical history and time since last visit, based on ACC/AHA guidelines, Joel Kline is at acceptable risk for the planned procedure without further cardiovascular testing.   I will route this recommendation to the requesting party via Epic fax function and remove from pre-op pool.  Please call with questions.  Marae Cottrell D Florencio Hollibaugh, NP 09/05/2023, 5:05 PM

## 2023-09-08 DIAGNOSIS — M546 Pain in thoracic spine: Secondary | ICD-10-CM | POA: Diagnosis not present

## 2023-09-08 DIAGNOSIS — M9904 Segmental and somatic dysfunction of sacral region: Secondary | ICD-10-CM | POA: Diagnosis not present

## 2023-09-08 DIAGNOSIS — M9903 Segmental and somatic dysfunction of lumbar region: Secondary | ICD-10-CM | POA: Diagnosis not present

## 2023-09-08 DIAGNOSIS — M9902 Segmental and somatic dysfunction of thoracic region: Secondary | ICD-10-CM | POA: Diagnosis not present

## 2023-09-08 DIAGNOSIS — M47897 Other spondylosis, lumbosacral region: Secondary | ICD-10-CM | POA: Diagnosis not present

## 2023-09-08 DIAGNOSIS — M5388 Other specified dorsopathies, sacral and sacrococcygeal region: Secondary | ICD-10-CM | POA: Diagnosis not present

## 2023-09-15 DIAGNOSIS — M542 Cervicalgia: Secondary | ICD-10-CM | POA: Diagnosis not present

## 2023-09-15 DIAGNOSIS — R7303 Prediabetes: Secondary | ICD-10-CM | POA: Diagnosis not present

## 2023-09-15 DIAGNOSIS — M5459 Other low back pain: Secondary | ICD-10-CM | POA: Diagnosis not present

## 2023-09-15 DIAGNOSIS — M9901 Segmental and somatic dysfunction of cervical region: Secondary | ICD-10-CM | POA: Diagnosis not present

## 2023-09-15 DIAGNOSIS — M9903 Segmental and somatic dysfunction of lumbar region: Secondary | ICD-10-CM | POA: Diagnosis not present

## 2023-09-15 DIAGNOSIS — M9902 Segmental and somatic dysfunction of thoracic region: Secondary | ICD-10-CM | POA: Diagnosis not present

## 2023-09-15 DIAGNOSIS — N189 Chronic kidney disease, unspecified: Secondary | ICD-10-CM | POA: Diagnosis not present

## 2023-09-15 DIAGNOSIS — R339 Retention of urine, unspecified: Secondary | ICD-10-CM | POA: Diagnosis not present

## 2023-09-15 DIAGNOSIS — K3189 Other diseases of stomach and duodenum: Secondary | ICD-10-CM | POA: Diagnosis not present

## 2023-09-15 DIAGNOSIS — N1831 Chronic kidney disease, stage 3a: Secondary | ICD-10-CM | POA: Diagnosis not present

## 2023-09-15 DIAGNOSIS — N179 Acute kidney failure, unspecified: Secondary | ICD-10-CM | POA: Diagnosis not present

## 2023-09-15 DIAGNOSIS — M1990 Unspecified osteoarthritis, unspecified site: Secondary | ICD-10-CM | POA: Diagnosis not present

## 2023-09-15 DIAGNOSIS — I10 Essential (primary) hypertension: Secondary | ICD-10-CM | POA: Diagnosis not present

## 2023-09-15 DIAGNOSIS — N1330 Unspecified hydronephrosis: Secondary | ICD-10-CM | POA: Diagnosis not present

## 2023-09-15 DIAGNOSIS — M546 Pain in thoracic spine: Secondary | ICD-10-CM | POA: Diagnosis not present

## 2023-09-16 ENCOUNTER — Ambulatory Visit

## 2023-09-16 VITALS — BP 110/74 | HR 55 | Ht 71.0 in | Wt 172.8 lb

## 2023-09-16 DIAGNOSIS — Z01810 Encounter for preprocedural cardiovascular examination: Secondary | ICD-10-CM

## 2023-09-16 DIAGNOSIS — I2510 Atherosclerotic heart disease of native coronary artery without angina pectoris: Secondary | ICD-10-CM

## 2023-09-16 DIAGNOSIS — I77810 Thoracic aortic ectasia: Secondary | ICD-10-CM | POA: Diagnosis not present

## 2023-09-16 DIAGNOSIS — I10 Essential (primary) hypertension: Secondary | ICD-10-CM | POA: Diagnosis not present

## 2023-09-16 DIAGNOSIS — I340 Nonrheumatic mitral (valve) insufficiency: Secondary | ICD-10-CM | POA: Diagnosis not present

## 2023-09-16 NOTE — Progress Notes (Signed)
 Cardiology Consultation:    Date:  09/16/2023   ID:  Joel Kline, Joel Kline Apr 02, 1955, MRN [id number]  PCP:  Joel Lauraine DASEN, NP  Cardiologist:  Joel Kline Joel Perezperez, MD   Referring MD: Joel Lauraine DASEN, NP   No chief complaint on file.    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  Coronary atherosclerosis noted on cardiac PET stress imaging. Now being evaluated for robotic partial gastrectomy by Dr Aron tentatively scheduled for September 16.   Problem List Items Addressed This Visit     Benign essential hypertension - Primary   Well-controlled. On low-dose amlodipine 2.5 mg once daily.       Relevant Medications   amLODipine (NORVASC) 2.5 MG tablet   Preop cardiovascular exam   From cardiac standpoint and no ongoing active cardiac symptoms at this time. No evidence of ischemia on cardiac PET stress from July 2025.  Has good baseline functional status. Continues to work 3 days a week.  Proceed with his elective surgery as being planned for robotic assisted partial gastrectomy in September. No further investigations or medication changes required from cardiac standpoint. Perioperatively blood pressure management per surgical team, should be okay to hold off amlodipine as needed.      Mild ascending aorta dilatation (HCC)   Size measured 4.3 cm on echocardiogram and cardiac PET stress from July 2025.  Reviewed the results. Would recommend repeat imaging tentatively next July to assess for any significant  interval change with a repeat CT chest.       Relevant Medications   amLODipine (NORVASC) 2.5 MG tablet   Mild mitral regurgitation   Mild noted on echocardiogram July 2025.  Can follow-up with repeat echocardiogram in about 2 to 3 years.      Relevant Medications   amLODipine (NORVASC) 2.5 MG tablet   Coronary atherosclerosis   Incidental finding on cardiac PET/CT stress test July 2025. No evidence of ischemia.  Overall would discuss further cardiac risk reduction strategies with need for aspirin and statins in the long run after his acute issues pertaining to gastrointestinal tumor addressed.  Tentatively scheduled for follow-up in 8 to 9 months to discuss long-term need for aspirin and statins.      Relevant Medications   amLODipine (NORVASC) 2.5 MG tablet   Return to clinic tentatively in 8 to 9 months.   History of Present Illness:    Joel Kline is a 68 y.o. male who is being seen today for follow-up visit. PCP is Joel Lauraine DASEN, NP. Last visit with me in the office was 07/12/2023.  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  Seen initially for consult for preop assessment prior to elective left shoulder surgery.  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  With symptoms of chronic dizziness and more recently with symptoms of shortness of breath. Further evaluation with cardiac PET stress was recommended in the setting of symptoms and nonspecific EKG changes.  Transthoracic echocardiogram 08/05/2023 was reassuring with normal  biventricular function EF 55 to 60%, mild MR  Cardiac PET stress completed 07/26/2023 noted no ischemia.  Extracardiac findings noted 3.5 cm left subsplenic region mass suspicious for gastrointestinal stromal tumor.  He has since underwent evaluation and now in the process of undergoing robotic partial gastrectomy to be done by Joel Kline surgery   Past Medical History:  Diagnosis Date   AKI (acute kidney injury) (HCC)    Benign essential hypertension    Cellulitis of left foot 09/29/2021   Elevated PSA    Family history of colon cancer 09/02/2023   Family history of ovarian cancer 09/02/2023   Gastric mass 09/02/2023   History of kidney stones    Hx of lithotripsy 1999   Pre-diabetes    Preop cardiovascular exam 07/12/2023   Rotator cuff arthropathy of right shoulder 08/05/2023   Urinary retention 09/02/2023    Past Surgical History:  Procedure Laterality Date   FOOT SURGERY  2023   INCISION AND DRAINAGE Left 09/30/2021   Procedure: INCISION AND DRAINAGE OF LEFT FOOT ABSCESS;  Surgeon: Malvin Marsa FALCON, DPM;  Location: [facility];  Service: Podiatry;  Laterality: Left;    Current Medications: Current Meds  Medication Sig   amLODipine (NORVASC) 2.5 MG tablet Take 2.5 mg by mouth daily.   tamsulosin (FLOMAX) 0.4 MG CAPS capsule Take 0.4 mg by mouth daily.   traMADol (ULTRAM) 50 MG tablet Take 50 mg by mouth 2 (two) times daily as needed.     Allergies:   Tramadol   Social History   Socioeconomic History   Marital status: Divorced    Spouse name: Not on file   Number of children: Not on file   Years of education: Not on file   Highest education level: Not on file  Occupational History   Not on file  Tobacco Use   Smoking status: Never   Smokeless tobacco: Never  Vaping Use   Vaping status: Never Used  Substance and Sexual Activity   Alcohol use: Not Currently    Comment: Rare   Drug use: Not Currently    Types: Marijuana    Comment: not  recent   Sexual activity: Not Currently  Other Topics Concern   Not on file  Social History Narrative   Not on file   Social Drivers of Health   Financial Resource Strain: Not on file  Food Insecurity: No Food Insecurity (09/29/2021)   Hunger Vital Sign    Worried About Running Out of Food in the Last Year: Never true    Ran Out of Food in the Last Year: Never true  Transportation Needs: No Transportation Needs (09/29/2021)   PRAPARE - Administrator, Civil Service (Medical): No    Lack of Transportation (Non-Medical): No  Physical Activity: Not on file  Stress: Not on file  Social Connections: Not on file     Family History: The patient's family history includes Colon cancer in his maternal grandmother; Ovarian cancer in his mother. ROS:   Please see the history of present illness.    All 14 point review of systems negative except as described per history of present illness.  EKGs/Labs/Other Studies Reviewed:  The following studies were reviewed today:   EKG:       Recent Labs: No results found for requested labs within last 365 days.  Recent Lipid Panel No results found for: CHOL, TRIG, HDL, CHOLHDL, VLDL, LDLCALC, LDLDIRECT  Physical Exam:    VS:  BP 110/74   Pulse (!) 55   Ht 5' 11 (1.803 m)   Wt 172 lb 12.8 oz (78.4 kg)   SpO2 97%   BMI 24.10 kg/m     Wt Readings from Last 3 Encounters:  09/16/23 172 lb 12.8 oz (78.4 kg)  07/12/23 177 lb (80.3 kg)  07/07/23 174 lb (78.9 kg)     GENERAL:  Well nourished, well developed in no acute distress CARDIAC: RRR, S1 and S2 present, no murmurs, no rubs, no gallops CHEST:  Clear to auscultation without rales, wheezing or rhonchi  NEUROLOGIC:  Alert and oriented x 3  Medication Adjustments/Labs and Tests Ordered: Current medicines are reviewed at length with the patient today.  Concerns regarding medicines are outlined above.  No orders of the defined types were placed in this  encounter.  No orders of the defined types were placed in this encounter.   Signed, Joel jess Kobus, MD, MPH, [facility]. 09/16/2023 1:36 PM    [facility]

## 2023-09-16 NOTE — Assessment & Plan Note (Signed)
 Incidental finding on cardiac PET/CT stress test July 2025. No evidence of ischemia.  Overall would discuss further cardiac risk reduction strategies with need for aspirin and statins in the long run after his acute issues pertaining to gastrointestinal tumor addressed.  Tentatively scheduled for follow-up in 8 to 9 months to discuss long-term need for aspirin and statins.

## 2023-09-16 NOTE — Assessment & Plan Note (Signed)
 Size measured 4.3 cm on echocardiogram and cardiac PET stress from July 2025.  Reviewed the results. Would recommend repeat imaging tentatively next July to assess for any significant interval change with a repeat CT chest.

## 2023-09-16 NOTE — Assessment & Plan Note (Signed)
 From cardiac standpoint and no ongoing active cardiac symptoms at this time. No evidence of ischemia on cardiac PET stress from July 2025.  Has good baseline functional status. Continues to work 3 days a week.  Proceed with his elective surgery as being planned for robotic assisted partial gastrectomy in September. No further investigations or medication changes required from cardiac standpoint. Perioperatively blood pressure management per surgical team, should be okay to hold off amlodipine as needed.

## 2023-09-16 NOTE — Assessment & Plan Note (Signed)
 Well-controlled. On low-dose amlodipine 2.5 mg once daily.

## 2023-09-16 NOTE — Patient Instructions (Signed)
 Medication Instructions:  Your physician recommends that you continue on your current medications as directed. Please refer to the Current Medication list given to you today.  *If you need a refill on your cardiac medications before your next appointment, please call your pharmacy*  Lab Work: None If you have labs (blood work) drawn today and your tests are completely normal, you will receive your results only by: MyChart Message (if you have MyChart) OR A paper copy in the mail If you have any lab test that is abnormal or we need to change your treatment, we will call you to review the results.  Testing/Procedures: None  Follow-Up: At [facility], you and your health needs are our priority.  As part of our continuing mission to provide you with exceptional heart care, our providers are all part of one team.  This team includes your primary Cardiologist (physician) and Advanced Practice Providers or APPs (Physician Assistants and Nurse Practitioners) who all work together to provide you with the care you need, when you need it.  Your next appointment:   9 month(s)  Provider:   Alean Kobus, MD    We recommend signing up for the patient portal called MyChart.  Sign up information is provided on this After Visit Summary.  MyChart is used to connect with patients for Virtual Visits (Telemedicine).  Patients are able to view lab/test results, encounter notes, upcoming appointments, etc.  Non-urgent messages can be sent to your provider as well.   To learn more about what you can do with MyChart, go to [email or web address].   Other Instructions None

## 2023-09-16 NOTE — Assessment & Plan Note (Signed)
 Mild noted on echocardiogram July 2025.  Can follow-up with repeat echocardiogram in about 2 to 3 years.

## 2023-09-22 DIAGNOSIS — M9903 Segmental and somatic dysfunction of lumbar region: Secondary | ICD-10-CM | POA: Diagnosis not present

## 2023-09-22 DIAGNOSIS — M9902 Segmental and somatic dysfunction of thoracic region: Secondary | ICD-10-CM | POA: Diagnosis not present

## 2023-09-22 DIAGNOSIS — M542 Cervicalgia: Secondary | ICD-10-CM | POA: Diagnosis not present

## 2023-09-22 DIAGNOSIS — M546 Pain in thoracic spine: Secondary | ICD-10-CM | POA: Diagnosis not present

## 2023-09-22 DIAGNOSIS — M5459 Other low back pain: Secondary | ICD-10-CM | POA: Diagnosis not present

## 2023-09-22 DIAGNOSIS — M9901 Segmental and somatic dysfunction of cervical region: Secondary | ICD-10-CM | POA: Diagnosis not present

## 2023-09-29 DIAGNOSIS — M542 Cervicalgia: Secondary | ICD-10-CM | POA: Diagnosis not present

## 2023-09-29 DIAGNOSIS — M9901 Segmental and somatic dysfunction of cervical region: Secondary | ICD-10-CM | POA: Diagnosis not present

## 2023-09-29 DIAGNOSIS — M9903 Segmental and somatic dysfunction of lumbar region: Secondary | ICD-10-CM | POA: Diagnosis not present

## 2023-09-29 DIAGNOSIS — M5459 Other low back pain: Secondary | ICD-10-CM | POA: Diagnosis not present

## 2023-09-29 DIAGNOSIS — M9902 Segmental and somatic dysfunction of thoracic region: Secondary | ICD-10-CM | POA: Diagnosis not present

## 2023-09-29 DIAGNOSIS — M546 Pain in thoracic spine: Secondary | ICD-10-CM | POA: Diagnosis not present

## 2023-09-30 DIAGNOSIS — R338 Other retention of urine: Secondary | ICD-10-CM | POA: Diagnosis not present

## 2023-10-04 NOTE — Progress Notes (Signed)
 Surgical Instructions   Your procedure is scheduled on Tuesday, September 16th. Report to [facility] Main Entrance A at 6:30 A.M., then check in with the Admitting office. Any questions or running late day of surgery: call [phone]  Questions prior to your surgery date: call [phone], Monday-Friday, 8am-4pm. If you experience any cold or flu symptoms such as cough, fever, chills, shortness of breath, etc. between now and your scheduled surgery, please notify us  at the above number.     Remember:  Do not eat after midnight the night before your surgery   You may drink clear liquids until 5:30 the morning of your surgery.   Clear liquids allowed are: Water, Non-Citrus Juices (without pulp), Carbonated Beverages, Clear Tea (no milk, honey, etc.), Black Coffee Only (NO MILK, CREAM OR POWDERED CREAMER of any kind), and Gatorade.    Take these medicines the morning of surgery with A SIP OF WATER  amLODipine (NORVASC)    May take these medicines IF NEEDED: baclofen (LIORESAL)  traMADol (ULTRAM)    One week prior to surgery, STOP taking any Aspirin (unless otherwise instructed by your surgeon) Aleve, Naproxen, Ibuprofen, Motrin, Advil, Goody's, BC's, all herbal medications, fish oil, and non-prescription vitamins.                     Do NOT Smoke (Tobacco/Vaping) for 24 hours prior to your procedure.  If you use a CPAP at night, you may bring your mask/headgear for your overnight stay.   You will be asked to remove any contacts, glasses, piercing's, hearing aid's, dentures/partials prior to surgery. Please bring cases for these items if needed.    Patients discharged the day of surgery will not be allowed to drive home, and someone needs to stay with them for 24 hours.  SURGICAL WAITING ROOM VISITATION Patients may have no more than 2 support people in the waiting area - these visitors may rotate.   Pre-op nurse will coordinate an appropriate time for 1 ADULT support  person, who may not rotate, to accompany patient in pre-op.  Children under the age of 37 must have an adult with them who is not the patient and must remain in the main waiting area with an adult.  If the patient needs to stay at the hospital during part of their recovery, the visitor guidelines for inpatient rooms apply.  Please refer to the [facility] website for the visitor guidelines for any additional information.   If you received a COVID test during your pre-op visit  it is requested that you wear a mask when out in public, stay away from anyone that may not be feeling well and notify your surgeon if you develop symptoms. If you have been in contact with anyone that has tested positive in the last 10 days please notify you surgeon.      Pre-operative CHG Bathing Instructions   You can play a key role in reducing the risk of infection after surgery. Your skin needs to be as free of germs as possible. You can reduce the number of germs on your skin by washing with CHG (chlorhexidine  gluconate) soap before surgery. CHG is an antiseptic soap that kills germs and continues to kill germs even after washing.   DO NOT use if you have an allergy to chlorhexidine /CHG or antibacterial soaps. If your skin becomes reddened or irritated, stop using the CHG and notify one of our RNs at [phone].  TAKE A SHOWER THE NIGHT BEFORE SURGERY AND THE DAY OF SURGERY    Please keep in mind the following:  DO NOT shave, including legs and underarms, 48 hours prior to surgery.   You may shave your face before/day of surgery.  Place clean sheets on your bed the night before surgery Use a clean washcloth (not used since being washed) for each shower. DO NOT sleep with pet's night before surgery.  CHG Shower Instructions:  Wash your face and private area with normal soap. If you choose to wash your hair, wash first with your normal shampoo.  After you use shampoo/soap, rinse your hair and  body thoroughly to remove shampoo/soap residue.  Turn the water OFF and apply half the bottle of CHG soap to a CLEAN washcloth.  Apply CHG soap ONLY FROM YOUR NECK DOWN TO YOUR TOES (washing for 3-5 minutes)  DO NOT use CHG soap on face, private areas, open wounds, or sores.  Pay special attention to the area where your surgery is being performed.  If you are having back surgery, having someone wash your back for you may be helpful. Wait 2 minutes after CHG soap is applied, then you may rinse off the CHG soap.  Pat dry with a clean towel  Put on clean pajamas    Additional instructions for the day of surgery: DO NOT APPLY any lotions, deodorants, cologne, or perfumes.   Do not wear jewelry or makeup Do not wear nail polish, gel polish, artificial nails, or any other type of covering on natural nails (fingers and toes) Do not bring valuables to the hospital. [facility] is not responsible for valuables/personal belongings. Put on clean/comfortable clothes.  Please brush your teeth.  Ask your nurse before applying any prescription medications to the skin.

## 2023-10-04 NOTE — H&P (Signed)
 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 He needs bilateral shoulder replacements down had a cardiac perfusion scan that demonstrated a 3.5 cm mass. He subsequently underwent an MRI of the abdomen which shows a 3.7 cm exophytic mass that appears to come from the stomach and is separate from the splenic flexure. It is felt to be suspicious for a GI stromal tumor.  He denies any epigastric or upper abdominal pain. He does have some very mild right sided inguinal and lateral abdominal pain.   He does have some urinary retention and has chronic indwelling catheter. He is undergoing workup at [facility]rology for this. He has failed several trials of voiding.  His mother had ovarian cancer and he has multiple people on his mother side the family that had colon cancer.  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   MR abd [place] 07/26/23  FINDINGS: Liver: And out of phase images shows no fatty infiltration liver. Liver negative. No focal lesion. No abnormal enhancement.  Biliary: Gallbladder partially contracted. No intrahepatic or extrahepatic ductal dilatation.  Pancreas: Negative.  Spleen: Negative.  Adrenals: Negative.  Kidneys: Several simple cysts but no focal mass.  Gastrointestinal tract: There is a round mass between the fundus of stomach and splenic flexure that does touch the fundus of the stomach and extends exophytically from the stomach. This lesion measures 3.7 by 2.7 cm and does enhance in the somewhat  heterogeneous fashion from the periphery centrally and on delayed images fully enhances This lesion is separate from the adjacent splenic flexure. There is some restricted diffusion noted Remainder of the imaged small bowel air remainder of stomach negative.  Lymph Nodes: No definitive adenopathy in the left upper abdomen or adjacent retroperitoneum.  Major Vessels: Intact  Bones: Normal bone marrow signal.  IMPRESSION: 3.7 x 2.7 cm mass exophytic from the stomach and likely rising from the stomach. Differential considerations include gastrointestinal stromal tumor versus primary malignancy.  Review of Systems: A complete review of systems was obtained from the patient. I have reviewed this information and discussed as appropriate with the patient. See HPI as well for other ROS.  Review of Systems  HENT: Positive for hearing loss and tinnitus.  Gastrointestinal: Positive for abdominal pain and heartburn.  Psychiatric/Behavioral: Positive for depression. The patient is nervous/anxious.  All other systems reviewed and are negative.   Medical History: Past Medical History:  Diagnosis Date  AKI (acute kidney injury) ()  Anxiety  Hypertension   Patient Active Problem List  Diagnosis  Gastric mass  Family history of ovarian cancer  Family history of colon cancer  Urinary retention   Past Surgical History:  Procedure Laterality Date  Foot Surgery 2023    Allergies  Allergen Reactions  Tramadol Rash   Current Outpatient Medications on File Prior to Visit  Medication Sig Dispense Refill  amLODIPine (NORVASC) 5 MG tablet take 1 tablet by mouth daily every morning  baclofen (LIORESAL) 10 MG tablet 1 qhs prn spasm, pain  calcium carbonate (TUMS ULTRA) 400 mg calcium (1,000 mg) chewable tablet Take 400 mg of elemental by mouth  every 2 (two) hours as needed for Heartburn  cloNIDine HCL (CATAPRES) 0.1 MG tablet Take 0.1 mg by mouth  magnesium malate, bulk, 16.2 % Powd Take 1  tablet by mouth once daily  tamsulosin (FLOMAX) 0.4 mg capsule   No current facility-administered medications on file prior to visit.   History reviewed. No pertinent family history.   Social History   Tobacco Use  Smoking Status Never  Smokeless Tobacco Never    Social History   Socioeconomic History  Marital status: Divorced  Tobacco Use  Smoking status: Never  Smokeless tobacco: Never  Vaping Use  Vaping status: Never Used  Substance and Sexual Activity  Alcohol use: Yes  Alcohol/week: 4.0 - 14.0 standard drinks of alcohol  Types: 4 - 14 Standard drinks or equivalent per week  Drug use: Never    Objective:   Vitals:  BP: (!) 148/84  Pulse: 72  Temp: 36.4 C (97.6 F)  SpO2: 99%  Weight: 77.7 kg (171 lb 6.4 oz)  Height: 180.3 cm (5' 11)  PainSc: 0-No pain  PainLoc: Abdomen   Body mass index is 23.91 kg/m.  Head: Normocephalic and atraumatic.  Mouth/Throat: Oropharynx is clear and moist. No oropharyngeal exudate.  Eyes: Conjunctivae are normal. Pupils are equal, round, and reactive to light. No scleral icterus.  Neck: Normal range of motion. Neck supple. No tracheal deviation present. No thyromegaly present.  Cardiovascular: Normal rate, regular rhythm, normal heart sounds and intact distal pulses. Exam reveals no gallop and no friction rub.  No murmur heard. Respiratory: Effort normal and breath sounds normal. No respiratory distress. No wheezes, rales or rhonchi. No chest wall tenderness.  GI: Soft. Bowel sounds are normal. Abdomen is soft, non tender, non distended. No masses or hepatosplenomegaly is present. There is no rebound and no guarding.  Musculoskeletal: . Extremities are non tender and without deformity.  Lymphadenopathy: No cervical or axillary adenopathy.  Neurological: Alert and oriented to person, place, and time. Coordination normal.  Skin: Skin is warm and dry. No rash noted. No diaphoresis. No erythema. No pallor.  Psychiatric: Normal  mood and affect.Behavior is normal. Judgment and thought content normal.   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 Will schedule him for robotic removal. I discussed the procedure with him and his partner. I reviewed the anatomy as well as the process of excising the mass. I discussed that we typically would either use a stapler or cautery and suture to close the defect. I think he will likely need to stay overnight but will possibly need longer depending on the amount of stomach that has to be removed and the proximity of this to the pylorus.  Discussed risk of bleeding, infection, damage to adjacent structures, possible need for additional surgeries or procedures, possible heart or lung complications, possible blood clot, possible additional complications. I reviewed postoperative restrictions which were to be at a minimum of 2 weeks and possibly longer. I did advise him to do this prior to shoulder replacement surgery due to the infectious risk of dividing the stomach lining.  We will have the endoscope available for any issues getting the mass.  I will contact Dr. Carolee to see if his workup could potentially need any thing  done in the operating room. It sounds as though the neck step of his workup is probably just outpatient, but will see if this can be condensed.  He has cardiac clearance for shoulder surgery, we will get formal clearance for his gastric surgery, but I do not think he will need any additional testing.  I am referring him to genetic counseling due to his family history of ovarian and colon cancer. It would be likely for this appointment to be after his surgery such that they would have a diagnosis for this tumor  as well.

## 2023-10-05 ENCOUNTER — Encounter (HOSPITAL_COMMUNITY)
Admission: RE | Admit: 2023-10-05 | Discharge: 2023-10-05 | Disposition: A | Discharge status: Home / Self Care | Source: Ambulatory Visit | Attending: General Surgery | Admitting: General Surgery

## 2023-10-05 ENCOUNTER — Encounter (HOSPITAL_COMMUNITY): Payer: Self-pay

## 2023-10-05 ENCOUNTER — Other Ambulatory Visit: Payer: Self-pay

## 2023-10-05 VITALS — BP 165/78 | HR 77 | Temp 98.1°F | Resp 17 | Ht 71.0 in | Wt 170.4 lb

## 2023-10-05 DIAGNOSIS — R42 Dizziness and giddiness: Secondary | ICD-10-CM | POA: Insufficient documentation

## 2023-10-05 DIAGNOSIS — Z01812 Encounter for preprocedural laboratory examination: Secondary | ICD-10-CM | POA: Insufficient documentation

## 2023-10-05 DIAGNOSIS — I10 Essential (primary) hypertension: Secondary | ICD-10-CM | POA: Insufficient documentation

## 2023-10-05 DIAGNOSIS — N289 Disorder of kidney and ureter, unspecified: Secondary | ICD-10-CM | POA: Diagnosis not present

## 2023-10-05 DIAGNOSIS — Z01818 Encounter for other preprocedural examination: Secondary | ICD-10-CM

## 2023-10-05 LAB — COMPREHENSIVE METABOLIC PANEL WITH GFR
ALT: 17 U/L (ref 0–44)
AST: 18 U/L (ref 15–41)
Albumin: 4.2 g/dL (ref 3.5–5.0)
Alkaline Phosphatase: 68 U/L (ref 38–126)
Anion gap: 9 (ref 5–15)
BUN: 14 mg/dL (ref 8–23)
CO2: 24 mmol/L (ref 22–32)
Calcium: 9.6 mg/dL (ref 8.9–10.3)
Chloride: 103 mmol/L (ref 98–111)
Creatinine, Ser: 1.33 mg/dL — ABNORMAL HIGH (ref 0.61–1.24)
GFR, Estimated: 59 mL/min — ABNORMAL LOW (ref >60)
Glucose, Bld: 107 mg/dL — ABNORMAL HIGH (ref 70–99)
Potassium: 3.7 mmol/L (ref 3.5–5.1)
Sodium: 136 mmol/L (ref 135–145)
Total Bilirubin: 0.8 mg/dL (ref 0.0–1.2)
Total Protein: 7.9 g/dL (ref 6.5–8.1)

## 2023-10-05 LAB — CBC
HCT: 46.7 % (ref 39.0–52.0)
Hemoglobin: 15.2 g/dL (ref 13.0–17.0)
MCH: 30.8 pg (ref 26.0–34.0)
MCHC: 32.5 g/dL (ref 30.0–36.0)
MCV: 94.5 fL (ref 80.0–100.0)
Platelets: 252 K/uL (ref 150–400)
RBC: 4.94 MIL/uL (ref 4.22–5.81)
RDW: 13.3 % (ref 11.5–15.5)
WBC: 10.5 K/uL (ref 4.0–10.5)
nRBC: 0 % (ref 0.0–0.2)

## 2023-10-05 NOTE — Progress Notes (Signed)
 PCP - Dr. Lauraine Sic Cardiologist - Dr. Alean Madireddy  PPM/ICD - Denies Device Orders - n/a Rep Notified - n/a  Chest x-ray - N/A EKG - 07-12-23 Stress Test - 07-26-23 ECHO - 08-05-23 Cardiac Cath - Denies  Sleep Study - Denies CPAP - n/a  NON-diabetic  Last dose of GLP1 agonist-  Denies GLP1 instructions: n/a  Blood Thinner Instructions: Denies Aspirin Instructions:n/a  ERAS Protcol - Clears until 0530 PRE-SURGERY Ensure or G2- none  COVID TEST- n/a   Anesthesia review: Yes, cardiac clearance 09-16-23, HTN, CKD  Patient denies shortness of breath, fever, cough and chest pain at PAT appointment. Patient denies any respiratory issues at this time.    All instructions explained to the patient, with a verbal understanding of the material. Patient agrees to go over the instructions while at home for a better understanding. Patient also instructed to self quarantine after being tested for COVID-19. The opportunity to ask questions was provided.

## 2023-10-06 DIAGNOSIS — M9904 Segmental and somatic dysfunction of sacral region: Secondary | ICD-10-CM | POA: Diagnosis not present

## 2023-10-06 DIAGNOSIS — M542 Cervicalgia: Secondary | ICD-10-CM | POA: Diagnosis not present

## 2023-10-06 DIAGNOSIS — M546 Pain in thoracic spine: Secondary | ICD-10-CM | POA: Diagnosis not present

## 2023-10-06 DIAGNOSIS — M9902 Segmental and somatic dysfunction of thoracic region: Secondary | ICD-10-CM | POA: Diagnosis not present

## 2023-10-06 DIAGNOSIS — M9901 Segmental and somatic dysfunction of cervical region: Secondary | ICD-10-CM | POA: Diagnosis not present

## 2023-10-06 DIAGNOSIS — M461 Sacroiliitis, not elsewhere classified: Secondary | ICD-10-CM | POA: Diagnosis not present

## 2023-10-06 NOTE — Anesthesia Preprocedure Evaluation (Addendum)
 Anesthesia Evaluation  Patient identified by MRN, date of birth, ID band Patient awake    Reviewed: Allergy & Precautions, NPO status , Patient's Chart, lab work & pertinent test results, reviewed documented beta blocker date and time   History of Anesthesia Complications Negative for: history of anesthetic complications  Airway Mallampati: II  TM Distance: >3 FB     Dental no notable dental hx.    Pulmonary neg COPD   breath sounds clear to auscultation       Cardiovascular hypertension, + CAD  (-) Past MI, (-) Cardiac Stents and (-) CABG  Rhythm:Regular Rate:Normal  IMPRESSIONS     1. Left ventricular ejection fraction, by estimation, is 55 to 60%. The  left ventricle has normal function. The left ventricle has no regional  wall motion abnormalities. Left ventricular diastolic parameters were  normal. The average left ventricular  global longitudinal strain is -18.5 %. The global longitudinal strain is  normal.   2. Right ventricular systolic function is normal. The right ventricular  size is normal. There is normal pulmonary artery systolic pressure.   3. The mitral valve is normal in structure. Mild mitral valve  regurgitation. No evidence of mitral stenosis.   4. The aortic valve is normal in structure. Aortic valve regurgitation is  not visualized. No aortic stenosis is present.   5. Aneurysm of the ascending aorta, measuring 42 mm.   6. The inferior vena cava is normal in size with greater than 50%  respiratory variability, suggesting right atrial pressure of 3 mmHg.     Neuro/Psych neg Seizures    GI/Hepatic hiatal hernia,neg GERD  ,,(+) neg Cirrhosis        Endo/Other    Renal/GU Renal disease     Musculoskeletal   Abdominal   Peds  Hematology   Anesthesia Other Findings   Reproductive/Obstetrics                              Anesthesia Physical Anesthesia Plan  ASA:  3  Anesthesia Plan: General   Post-op Pain Management:    Induction: Intravenous  PONV Risk Score and Plan: 2 and Ondansetron  and Dexamethasone   Airway Management Planned: Oral ETT  Additional Equipment:   Intra-op Plan:   Post-operative Plan: Extubation in OR  Informed Consent: I have reviewed the patients History and Physical, chart, labs and discussed the procedure including the risks, benefits and alternatives for the proposed anesthesia with the patient or authorized representative who has indicated his/her understanding and acceptance.     Dental advisory given  Plan Discussed with: CRNA  Anesthesia Plan Comments: (PAT note by Lynwood Hope, PA-C: 68 yo male follows with cardiology for hx of HTN, chronic dizziness, mild MR, mildly dilated ascending aorta 4.3 cm (on echocardiogram and cardiac PET from July 2025). Cardiac PET in July 2025 with no ischemia and normal biventricular function. 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 Last seen by Dr. Liborio 09/16/23 for preop eval. Per note, From cardiac standpoint and no ongoing active cardiac symptoms at this time. No evidence of ischemia on cardiac PET stress from July 2025. Has good baseline functional status. Continues to work 3 days a week. Proceed with his elective surgery as being planned for robotic assisted partial gastrectomy in September. No further investigations or medication changes required from cardiac standpoint. Perioperatively blood pressure management per surgical team, should be okay to  hold off amlodipine as needed.  Other pertinent hx includes renal insufficiency. Per PCP notes scanned in Media, recent AKI 05/2023 with creatinine 2.3. Repeat CMP 07/07/23 with creatinine again elevated at 2.5. Fortunately preop labs 10/15/23 show improvement in renal function with creatinine 1.33. Remainder of preop labs unremarkable.   Preop labs  reviewed, creatinine mildly elevated 1.33, otherwise unremarkable  EKG 07/12/23: Sinus bradycardia. Rate 57. t wave inversion in leads 3 and aVF  TTE 08/05/23: 1. Left ventricular ejection fraction, by estimation, is 55 to 60%. The  left ventricle has normal function. The left ventricle has no regional  wall motion abnormalities. Left ventricular diastolic parameters were  normal. The average left ventricular  global longitudinal strain is -18.5 %. The global longitudinal strain is  normal.   2. Right ventricular systolic function is normal. The right ventricular  size is normal. There is normal pulmonary artery systolic pressure.   3. The mitral valve is normal in structure. Mild mitral valve  regurgitation. No evidence of mitral stenosis.   4. The aortic valve is normal in structure. Aortic valve regurgitation is  not visualized. No aortic stenosis is present.   5. Aneurysm of the ascending aorta, measuring 42 mm.   6. The inferior vena cava is normal in size with greater than 50%  respiratory variability, suggesting right atrial pressure of 3 mmHg.   Cardiac PET CT 07/26/23:   The study is normal. The study is low risk.   LV perfusion is normal.   Rest left ventricular function is normal. Rest EF: 50%. Stress left ventricular function is normal. Stress EF: 57%. End diastolic cavity size is normal. End systolic cavity size is normal.   Myocardial blood flow was computed to be 0.27ml/g/min at rest and 1.50ml/g/min at stress. Global myocardial blood flow reserve was 2.17 and was normal.   Coronary calcium was present on the attenuation correction CT images. Moderate coronary calcifications were present. Coronary calcifications were present in the left anterior descending artery distribution(s).   Electronically signed by Jerel Balding, MD  )         Anesthesia Quick Evaluation

## 2023-10-06 NOTE — Progress Notes (Signed)
 Anesthesia Chart Review:  68 yo male follows with cardiology for hx of HTN, chronic dizziness, mild MR, mildly dilated ascending aorta 4.3 cm (on echocardiogram and cardiac PET from July 2025). Cardiac PET in July 2025 with no ischemia and normal biventricular function. 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 Last seen by Dr. Liborio 09/16/23 for preop eval. Per note, From cardiac standpoint and no ongoing active cardiac symptoms at this time. No evidence of ischemia on cardiac PET stress from July 2025. Has good baseline functional status. Continues to work 3 days a week. Proceed with his elective surgery as being planned for robotic assisted partial gastrectomy in September. No further investigations or medication changes required from cardiac standpoint. Perioperatively blood pressure management per surgical team, should be okay to hold off amlodipine as needed.  Other pertinent hx includes renal insufficiency. Per PCP notes scanned in Media, recent AKI 05/2023 with creatinine 2.3. Repeat CMP 07/07/23 with creatinine again elevated at 2.5. Fortunately preop labs 10/15/23 show improvement in renal function with creatinine 1.33. Remainder of preop labs unremarkable.   Preop labs reviewed, creatinine mildly elevated 1.33, otherwise unremarkable  EKG 07/12/23: Sinus bradycardia. Rate 57. t wave inversion in leads 3 and aVF  TTE 08/05/23: 1. Left ventricular ejection fraction, by estimation, is 55 to 60%. The  left ventricle has normal function. The left ventricle has no regional  wall motion abnormalities. Left ventricular diastolic parameters were  normal. The average left ventricular  global longitudinal strain is -18.5 %. The global longitudinal strain is  normal.   2. Right ventricular systolic function is normal. The right ventricular  size is normal. There is normal pulmonary artery systolic pressure.   3. The mitral  valve is normal in structure. Mild mitral valve  regurgitation. No evidence of mitral stenosis.   4. The aortic valve is normal in structure. Aortic valve regurgitation is  not visualized. No aortic stenosis is present.   5. Aneurysm of the ascending aorta, measuring 42 mm.   6. The inferior vena cava is normal in size with greater than 50%  respiratory variability, suggesting right atrial pressure of 3 mmHg.   Cardiac PET CT 07/26/23:   The study is normal. The study is low risk.   LV perfusion is normal.   Rest left ventricular function is normal. Rest EF: 50%. Stress left ventricular function is normal. Stress EF: 57%. End diastolic cavity size is normal. End systolic cavity size is normal.   Myocardial blood flow was computed to be 0.48ml/g/min at rest and 1.50ml/g/min at stress. Global myocardial blood flow reserve was 2.17 and was normal.   Coronary calcium was present on the attenuation correction CT images. Moderate coronary calcifications were present. Coronary calcifications were present in the left anterior descending artery distribution(s).   Electronically signed by Jerel Balding, MD    Lynwood Hope, PA-C [facility]/Anesthesiology Phone [phone] 10/06/2023 1:31 PM

## 2023-10-11 ENCOUNTER — Inpatient Hospital Stay (HOSPITAL_BASED_OUTPATIENT_CLINIC_OR_DEPARTMENT_OTHER): Payer: Self-pay | Admitting: Certified Registered Nurse Anesthetist

## 2023-10-11 ENCOUNTER — Encounter (HOSPITAL_COMMUNITY)
Admission: RE | Disposition: A | Discharge status: Home / Self Care | Payer: Self-pay | Source: Home / Self Care | Attending: General Surgery

## 2023-10-11 ENCOUNTER — Other Ambulatory Visit: Payer: Self-pay

## 2023-10-11 ENCOUNTER — Ambulatory Visit (HOSPITAL_COMMUNITY)
Admission: RE | Admit: 2023-10-11 | Discharge: 2023-10-11 | Disposition: A | Discharge status: Home / Self Care | Attending: General Surgery | Admitting: General Surgery

## 2023-10-11 ENCOUNTER — Inpatient Hospital Stay (HOSPITAL_COMMUNITY): Payer: Self-pay | Admitting: Physician Assistant

## 2023-10-11 ENCOUNTER — Encounter (HOSPITAL_COMMUNITY): Payer: Self-pay | Admitting: General Surgery

## 2023-10-11 DIAGNOSIS — I2510 Atherosclerotic heart disease of native coronary artery without angina pectoris: Secondary | ICD-10-CM | POA: Diagnosis not present

## 2023-10-11 DIAGNOSIS — Z800 Family history of malignant neoplasm of digestive organs: Secondary | ICD-10-CM | POA: Diagnosis not present

## 2023-10-11 DIAGNOSIS — I10 Essential (primary) hypertension: Secondary | ICD-10-CM | POA: Insufficient documentation

## 2023-10-11 DIAGNOSIS — D1803 Hemangioma of intra-abdominal structures: Secondary | ICD-10-CM | POA: Diagnosis not present

## 2023-10-11 DIAGNOSIS — R339 Retention of urine, unspecified: Secondary | ICD-10-CM | POA: Diagnosis not present

## 2023-10-11 DIAGNOSIS — Z8041 Family history of malignant neoplasm of ovary: Secondary | ICD-10-CM | POA: Insufficient documentation

## 2023-10-11 DIAGNOSIS — Z960 Presence of urogenital implants: Secondary | ICD-10-CM | POA: Insufficient documentation

## 2023-10-11 DIAGNOSIS — K3189 Other diseases of stomach and duodenum: Secondary | ICD-10-CM | POA: Diagnosis present

## 2023-10-11 DIAGNOSIS — D1809 Hemangioma of other sites: Secondary | ICD-10-CM | POA: Diagnosis not present

## 2023-10-11 DIAGNOSIS — R160 Hepatomegaly, not elsewhere classified: Secondary | ICD-10-CM | POA: Diagnosis not present

## 2023-10-11 DIAGNOSIS — I340 Nonrheumatic mitral (valve) insufficiency: Secondary | ICD-10-CM

## 2023-10-11 HISTORY — PX: GASTRECTOMY, PARTIAL, ROBOT-ASSISTED: SHX7576

## 2023-10-11 LAB — TYPE AND SCREEN
ABO/RH(D): A POS
Antibody Screen: NEGATIVE

## 2023-10-11 LAB — ABO/RH: ABO/RH(D): A POS

## 2023-10-11 SURGERY — GASTRECTOMY, PARTIAL, ROBOT-ASSISTED
Anesthesia: General | Site: Abdomen

## 2023-10-11 MED ORDER — CHLORHEXIDINE GLUCONATE CLOTH 2 % EX PADS: 6.0000 | MEDICATED_PAD | Freq: Once | CUTANEOUS | Status: DC

## 2023-10-11 MED ORDER — MIDAZOLAM HCL 2 MG/2ML IJ SOLN
INTRAMUSCULAR | Status: AC
  Filled 2023-10-11: qty 2

## 2023-10-11 MED ORDER — OXYCODONE HCL 5 MG PO TABS
5.0000 mg | ORAL_TABLET | Freq: Once | ORAL | Status: AC | PRN
  Administered 2023-10-11: 5 mg via ORAL

## 2023-10-11 MED ORDER — OXYCODONE HCL 5 MG/5ML PO SOLN: 5.0000 mg | Freq: Once | ORAL | Status: AC | PRN

## 2023-10-11 MED ORDER — DEXAMETHASONE SODIUM PHOSPHATE 10 MG/ML IJ SOLN
INTRAMUSCULAR | Status: DC | PRN
  Administered 2023-10-11: 10 mg via INTRAVENOUS

## 2023-10-11 MED ORDER — OXYCODONE HCL 5 MG PO TABS
ORAL_TABLET | ORAL | Status: AC
  Filled 2023-10-11: qty 1

## 2023-10-11 MED ORDER — BUPIVACAINE-EPINEPHRINE (PF) 0.25% -1:200000 IJ SOLN
INTRAMUSCULAR | Status: AC
  Filled 2023-10-11: qty 30

## 2023-10-11 MED ORDER — OXYCODONE HCL 5 MG PO TABS: 5.0000 mg | ORAL_TABLET | Freq: Four times a day (QID) | ORAL | 0 refills | Status: AC | PRN

## 2023-10-11 MED ORDER — 0.9 % SODIUM CHLORIDE (POUR BTL) OPTIME
TOPICAL | Status: DC | PRN
  Administered 2023-10-11: 1000 mL

## 2023-10-11 MED ORDER — ONDANSETRON HCL 4 MG/2ML IJ SOLN: 4.0000 mg | Freq: Once | INTRAMUSCULAR | Status: DC | PRN

## 2023-10-11 MED ORDER — LACTATED RINGERS IV SOLN: INTRAVENOUS | Status: DC

## 2023-10-11 MED ORDER — FENTANYL CITRATE (PF) 100 MCG/2ML IJ SOLN
INTRAMUSCULAR | Status: AC
  Filled 2023-10-11: qty 2

## 2023-10-11 MED ORDER — LIDOCAINE HCL 1 % IJ SOLN
INTRAMUSCULAR | Status: DC | PRN
  Administered 2023-10-11: 20 mL

## 2023-10-11 MED ORDER — PHENYLEPHRINE HCL-NACL 20-0.9 MG/250ML-% IV SOLN
INTRAVENOUS | Status: DC | PRN
  Administered 2023-10-11: 20 ug/min via INTRAVENOUS

## 2023-10-11 MED ORDER — FENTANYL CITRATE (PF) 250 MCG/5ML IJ SOLN
INTRAMUSCULAR | Status: AC
  Filled 2023-10-11: qty 5

## 2023-10-11 MED ORDER — FENTANYL CITRATE (PF) 100 MCG/2ML IJ SOLN
25.0000 ug | INTRAMUSCULAR | Status: DC | PRN
  Administered 2023-10-11: 50 ug via INTRAVENOUS

## 2023-10-11 MED ORDER — ACETAMINOPHEN 10 MG/ML IV SOLN: 1000.0000 mg | Freq: Once | INTRAVENOUS | Status: DC | PRN

## 2023-10-11 MED ORDER — LIDOCAINE 2% (20 MG/ML) 5 ML SYRINGE
INTRAMUSCULAR | Status: DC | PRN
  Administered 2023-10-11: 100 mg via INTRAVENOUS

## 2023-10-11 MED ORDER — FENTANYL CITRATE (PF) 250 MCG/5ML IJ SOLN
INTRAMUSCULAR | Status: DC | PRN
  Administered 2023-10-11: 50 ug via INTRAVENOUS
  Administered 2023-10-11: 100 ug via INTRAVENOUS

## 2023-10-11 MED ORDER — MIDAZOLAM HCL 2 MG/2ML IJ SOLN
INTRAMUSCULAR | Status: DC | PRN
  Administered 2023-10-11: 2 mg via INTRAVENOUS

## 2023-10-11 MED ORDER — ONDANSETRON HCL 4 MG/2ML IJ SOLN
INTRAMUSCULAR | Status: DC | PRN
  Administered 2023-10-11: 4 mg via INTRAVENOUS

## 2023-10-11 MED ORDER — LIDOCAINE HCL 1 % IJ SOLN
INTRAMUSCULAR | Status: AC
  Filled 2023-10-11: qty 20

## 2023-10-11 MED ORDER — PROPOFOL 10 MG/ML IV BOLUS
INTRAVENOUS | Status: AC
  Filled 2023-10-11: qty 20

## 2023-10-11 MED ORDER — ACETAMINOPHEN 500 MG PO TABS
1000.0000 mg | ORAL_TABLET | ORAL | Status: AC
  Administered 2023-10-11: 1000 mg via ORAL
  Filled 2023-10-11: qty 2

## 2023-10-11 MED ORDER — SUGAMMADEX SODIUM 200 MG/2ML IV SOLN
INTRAVENOUS | Status: DC | PRN
  Administered 2023-10-11: 200 mg via INTRAVENOUS

## 2023-10-11 MED ORDER — PHENYLEPHRINE 80 MCG/ML (10ML) SYRINGE FOR IV PUSH (FOR BLOOD PRESSURE SUPPORT)
PREFILLED_SYRINGE | INTRAVENOUS | Status: DC | PRN
  Administered 2023-10-11 (×2): 80 ug via INTRAVENOUS

## 2023-10-11 MED ORDER — CEFAZOLIN SODIUM-DEXTROSE 2-4 GM/100ML-% IV SOLN
2.0000 g | INTRAVENOUS | Status: AC
  Administered 2023-10-11: 2 g via INTRAVENOUS
  Filled 2023-10-11: qty 100

## 2023-10-11 MED ORDER — LACTATED RINGERS IV SOLN: INTRAVENOUS | Status: DC | PRN

## 2023-10-11 MED ORDER — PROPOFOL 10 MG/ML IV BOLUS
INTRAVENOUS | Status: DC | PRN
  Administered 2023-10-11: 25 ug/kg/min via INTRAVENOUS
  Administered 2023-10-11: 100 mg via INTRAVENOUS

## 2023-10-11 MED ORDER — ROCURONIUM BROMIDE 10 MG/ML (PF) SYRINGE
PREFILLED_SYRINGE | INTRAVENOUS | Status: DC | PRN
  Administered 2023-10-11: 30 mg via INTRAVENOUS
  Administered 2023-10-11: 60 mg via INTRAVENOUS
  Administered 2023-10-11: 30 mg via INTRAVENOUS
  Administered 2023-10-11: 40 mg via INTRAVENOUS

## 2023-10-11 MED ORDER — ORAL CARE MOUTH RINSE: 15.0000 mL | Freq: Once | OROMUCOSAL | Status: AC

## 2023-10-11 MED ORDER — CHLORHEXIDINE GLUCONATE 0.12 % MT SOLN
15.0000 mL | Freq: Once | OROMUCOSAL | Status: AC
  Administered 2023-10-11: 15 mL via OROMUCOSAL
  Filled 2023-10-11: qty 15

## 2023-10-11 MED ORDER — EPHEDRINE SULFATE-NACL 50-0.9 MG/10ML-% IV SOSY
PREFILLED_SYRINGE | INTRAVENOUS | Status: DC | PRN
  Administered 2023-10-11: 10 mg via INTRAVENOUS
  Administered 2023-10-11: 5 mg via INTRAVENOUS

## 2023-10-11 SURGICAL SUPPLY — 85 items
BAG COUNTER SPONGE SURGICOUNT (BAG) IMPLANT
BLADE EXTENDED COATED 6.5IN (ELECTRODE) IMPLANT
CANNULA REDUCER 12-8 DVNC XI (CANNULA) IMPLANT
CELLS DAT CNTRL 66122 CELL SVR (MISCELLANEOUS) IMPLANT
CHLORAPREP W/TINT 26 (MISCELLANEOUS) ×1 IMPLANT
CLIP LIGATING HEM O LOK PURPLE (MISCELLANEOUS) IMPLANT
CLIP LIGATING HEMOLOK MED (MISCELLANEOUS) IMPLANT
COVER MAYO STAND STRL (DRAPES) ×1 IMPLANT
COVER SURGICAL LIGHT HANDLE (MISCELLANEOUS) ×1 IMPLANT
COVER TIP SHEARS 8 DVNC (MISCELLANEOUS) ×1 IMPLANT
DEFOGGER SCOPE WARM SEASHARP (MISCELLANEOUS) ×1 IMPLANT
DERMABOND ADVANCED .7 DNX12 (GAUZE/BANDAGES/DRESSINGS) ×1 IMPLANT
DEVICE TROCAR PUNCTURE CLOSURE (ENDOMECHANICALS) IMPLANT
DRAIN CHANNEL 19F RND (DRAIN) IMPLANT
DRAPE ARM DVNC X/XI (DISPOSABLE) ×4 IMPLANT
DRAPE COLUMN DVNC XI (DISPOSABLE) ×1 IMPLANT
DRAPE CV SPLIT W-CLR ANES SCRN (DRAPES) ×1 IMPLANT
DRAPE SURG IRRIG POUCH 19X23 (DRAPES) ×1 IMPLANT
DRAPE SURG ORHT 6 SPLT 77X108 (DRAPES) ×1 IMPLANT
DRIVER NDL LRG 8 DVNC XI (INSTRUMENTS) IMPLANT
DRIVER NDL MEGA SUTCUT DVNCXI (INSTRUMENTS) ×1 IMPLANT
DRIVER NDLE LRG 8 DVNC XI (INSTRUMENTS) IMPLANT
DRIVER NDLE MEGA SUTCUT DVNCXI (INSTRUMENTS) ×1 IMPLANT
DRSG OPSITE POSTOP 4X6 (GAUZE/BANDAGES/DRESSINGS) IMPLANT
DRSG OPSITE POSTOP 4X8 (GAUZE/BANDAGES/DRESSINGS) IMPLANT
ELECTRODE REM PT RTRN 9FT ADLT (ELECTROSURGICAL) ×1 IMPLANT
ENDOLOOP SUT PDS II 0 18 (SUTURE) IMPLANT
EVACUATOR SILICONE 100CC (DRAIN) IMPLANT
FORCEPS BPLR FENES DVNC XI (FORCEP) ×1 IMPLANT
FORCEPS CADIERE DVNC XI (FORCEP) IMPLANT
GAUZE SPONGE 4X4 12PLY STRL (GAUZE/BANDAGES/DRESSINGS) IMPLANT
GLOVE BIO SURGEON STRL SZ 6 (GLOVE) ×3 IMPLANT
GLOVE INDICATOR 6.5 STRL GRN (GLOVE) ×3 IMPLANT
GOWN STRL REUS W/ TWL LRG LVL3 (GOWN DISPOSABLE) ×2 IMPLANT
GOWN STRL REUS W/ TWL XL LVL3 (GOWN DISPOSABLE) ×5 IMPLANT
GRASPER TIP-UP FEN DVNC XI (INSTRUMENTS) ×1 IMPLANT
IRRIGATION SUCT STRKRFLW 2 WTP (MISCELLANEOUS) ×1 IMPLANT
KIT BASIN OR (CUSTOM PROCEDURE TRAY) ×1 IMPLANT
KIT TURNOVER KIT B (KITS) IMPLANT
LHOOK LAP DISP 36CM (ELECTROSURGICAL) IMPLANT
NDL 22X1.5 STRL (OR ONLY) (MISCELLANEOUS) ×1 IMPLANT
NDL HYPO 22X1.5 SAFETY MO (MISCELLANEOUS) ×1 IMPLANT
NDL INSUFFLATION 14GA 120MM (NEEDLE) ×1 IMPLANT
NEEDLE 22X1.5 STRL (OR ONLY) (MISCELLANEOUS) ×1 IMPLANT
NEEDLE HYPO 22X1.5 SAFETY MO (MISCELLANEOUS) ×1 IMPLANT
NEEDLE INSUFFLATION 14GA 120MM (NEEDLE) ×1 IMPLANT
OBTURATOR OPTICALSTD 8 DVNC (TROCAR) IMPLANT
PAD ARMBOARD POSITIONER FOAM (MISCELLANEOUS) ×2 IMPLANT
PENCIL BUTTON HOLSTER BLD 10FT (ELECTRODE) IMPLANT
PENCIL SMOKE EVACUATOR (MISCELLANEOUS) IMPLANT
PORT LAP GEL ALEXIS MED 5-9CM (MISCELLANEOUS) IMPLANT
POUCH RETRIEVAL ECOSAC 10 (ENDOMECHANICALS) ×1 IMPLANT
RELOAD STAPLE 60 3.5 BLU DVNC (STAPLE) IMPLANT
RELOAD STAPLE 60 4.3 GRN DVNC (STAPLE) IMPLANT
RETRACTOR WND ALEXIS 18 MED (MISCELLANEOUS) IMPLANT
SCISSORS LAP 5X35 DISP (ENDOMECHANICALS) IMPLANT
SCISSORS MNPLR CVD DVNC XI (INSTRUMENTS) ×1 IMPLANT
SEAL UNIV 5-12 XI (MISCELLANEOUS) ×3 IMPLANT
SEALER VESSEL EXT DVNC XI (MISCELLANEOUS) ×1 IMPLANT
SET TUBE SMOKE EVAC HIGH FLOW (TUBING) ×1 IMPLANT
SLEEVE XCEL OPT CAN 5 100 (ENDOMECHANICALS) IMPLANT
SPIKE FLUID TRANSFER (MISCELLANEOUS) ×1 IMPLANT
SPONGE T-LAP 18X18 ~~LOC~~+RFID (SPONGE) IMPLANT
STAPLER 60 SUREFORM DVNC (STAPLE) IMPLANT
STAPLER SKIN PROX 35W (STAPLE) ×1 IMPLANT
STOPCOCK 4 WAY LG BORE MALE ST (IV SETS) ×1 IMPLANT
SUT ETHILON 2 0 FS 18 (SUTURE) IMPLANT
SUT MNCRL AB 4-0 PS2 18 (SUTURE) ×1 IMPLANT
SUT NOVA NAB DX-16 0-1 5-0 T12 (SUTURE) ×1 IMPLANT
SUT PDS AB 1 CTX 36 (SUTURE) IMPLANT
SUT PDS AB 2-0 CT2 27 (SUTURE) IMPLANT
SUT PROLENE 2 0 SH DA (SUTURE) IMPLANT
SUT SILK 2 0 SH CR/8 (SUTURE) IMPLANT
SUT SILK 2-0 18XBRD TIE 12 (SUTURE) IMPLANT
SUT SILK 3 0 SH CR/8 (SUTURE) IMPLANT
SUT SILK 3-0 18XBRD TIE 12 (SUTURE) IMPLANT
SUT VICRYL 0 TIES 12 18 (SUTURE) IMPLANT
SUTURE STRAT PDS 2-0 30 CT-1.5 (SUTURE) IMPLANT
TIP INNERVISION DETACH 56FR (MISCELLANEOUS) IMPLANT
TOWEL GREEN STERILE FF (TOWEL DISPOSABLE) ×1 IMPLANT
TRAY FOLEY MTR SLVR 14FR STAT (SET/KITS/TRAYS/PACK) ×1 IMPLANT
TRAY FOLEY MTR SLVR 16FR STAT (SET/KITS/TRAYS/PACK) ×1 IMPLANT
TRAY LAPAROSCOPIC MC (CUSTOM PROCEDURE TRAY) ×1 IMPLANT
TROCAR ADV FIXATION 5X100MM (TROCAR) ×1 IMPLANT
TROCAR Z-THREAD OPTICAL 5X100M (TROCAR) IMPLANT

## 2023-10-11 NOTE — Discharge Instructions (Addendum)
 [facility] Office Phone Number [phone]   POST OP INSTRUCTIONS  Always review your discharge instruction sheet given to you by the facility where your surgery was performed.  IF YOU HAVE DISABILITY OR FAMILY LEAVE FORMS, YOU MUST BRING THEM TO THE OFFICE FOR PROCESSING.  DO NOT GIVE THEM TO YOUR DOCTOR.  Take 2 tylenol  (acetominophen) three times a day for 3 days.  If you still have pain, add ibuprofen with food in between if able to take this (if you have kidney issues or stomach issues, do not take ibuprofen).  If both of those are not enough, add the narcotic pain pill.  If you find you are needing a lot of this overnight after surgery, call the next morning for a refill.   Take your usually prescribed medications unless otherwise directed If you need a refill on your pain medication, please contact your pharmacy.  They will contact our office to request authorization.  Prescriptions will not be filled after 5pm or on week-ends. You should eat very light the first 24 hours after surgery, such as soup, crackers, pudding, etc.  Resume your normal diet the day after surgery It is common to experience some constipation if taking pain medication after surgery.  Increasing fluid intake and taking a stool softener will usually help or prevent this problem from occurring.  A mild laxative (Milk of Magnesia or Miralax) should be taken according to package directions if there are no bowel movements after 48 hours. You may shower in 48 hours.  The surgical glue will flake off in 2-3 weeks.   ACTIVITIES:  No strenuous activity or heavy lifting for 2 weeks.   You may drive when you no longer are taking prescription pain medication, you can comfortably wear a seatbelt, and you can safely maneuver your car and apply brakes. RETURN TO WORK:  _______1-2 weeks__________________ Rosine should see your doctor in the office for a follow-up appointment approximately three-four weeks after your  surgery.    WHEN TO CALL YOUR DOCTOR: Fever over 101.0 Nausea and/or vomiting. Extreme swelling or bruising. Continued bleeding from incision. Increased pain, redness, or drainage from the incision.  The clinic staff is available to answer your questions during regular business hours.  Please don't hesitate to call and ask to speak to one of the nurses for clinical concerns.  If you have a medical emergency, go to the nearest emergency room or call 911.  A surgeon from [facility] is always on call at the hospital.  For further questions, please visit [email or web address]

## 2023-10-11 NOTE — Transfer of Care (Signed)
 Immediate Anesthesia Transfer of Care Note  Patient: Joel Kline  Procedure(s) Performed: ROBOT-ASSISTED PARTIAL HEPATECTOMY (Abdomen)  Patient Location: PACU  Anesthesia Type:General  Level of Consciousness: awake and alert   Airway & Oxygen Therapy: Patient Spontanous Breathing and Patient connected to face mask oxygen  Post-op Assessment: Report given to RN and Post -op Vital signs reviewed and stable  Post vital signs: Reviewed and stable  Last Vitals:  Vitals Value Taken Time  BP 146/85 10/11/23 10:40  Temp    Pulse 71 10/11/23 10:43  Resp 18 10/11/23 10:43  SpO2 100 % 10/11/23 10:43  Vitals shown include unfiled device data.  Last Pain:  Vitals:   10/11/23 0733  TempSrc:   PainSc: 0-No pain         Complications: No notable events documented.

## 2023-10-11 NOTE — Progress Notes (Signed)
 50mcg Fentanly / 1mL wasted with Lancie Clark-Curry post patient discharge

## 2023-10-11 NOTE — Anesthesia Procedure Notes (Signed)
 Procedure Name: Intubation Date/Time: 10/11/2023 8:50 AM  Performed by: Vanice Search, RNPre-anesthesia Checklist: Patient identified, Emergency Drugs available, Suction available and Patient being monitored Patient Re-evaluated:Patient Re-evaluated prior to induction Oxygen Delivery Method: Circle system utilized Preoxygenation: Pre-oxygenation with 100% oxygen Induction Type: IV induction Ventilation: Two handed mask ventilation required Laryngoscope Size: Mac and 3 Grade View: Grade III Tube type: Oral Tube size: 7.0 mm Number of attempts: 1 Airway Equipment and Method: Stylet and Bougie stylet Placement Confirmation: ETT inserted through vocal cords under direct vision, positive ETCO2 and breath sounds checked- equal and bilateral Secured at: 24 cm Tube secured with: Tape Dental Injury: Teeth and Oropharynx as per pre-operative assessment

## 2023-10-11 NOTE — Progress Notes (Signed)
 Dr Aron called into [facility] to let us  know we are not to remove foley when discharging patient. Foley is to be left in, it is a chronic foley.

## 2023-10-11 NOTE — Interval H&P Note (Signed)
 History and Physical Interval Note:  10/11/2023 8:26 AM  Joel Kline Catholic  has presented today for surgery, with the diagnosis of GASTRIC MASS.  The various methods of treatment have been discussed with the patient and family. After consideration of risks, benefits and other options for treatment, the patient has consented to  Procedure(s) with comments: GASTRECTOMY, PARTIAL, ROBOT-ASSISTED (N/A) - ROBOTIC PARTIAL GASTRECTOMY as a surgical intervention.  The patient's history has been reviewed, patient examined, no change in status, stable for surgery.  I have reviewed the patient's chart and labs.  Questions were answered to the patient's satisfaction.     [name]

## 2023-10-11 NOTE — Op Note (Signed)
 PRE-OPERATIVE DIAGNOSIS: gastric mass  POST-OPERATIVE DIAGNOSIS:  liver mass  PROCEDURE:  Procedure(s): Robotic partial hepatectomy  SURGEON:  Surgeon(s): [name], MD  Assistant:   Leonor Dawn, MD  ANESTHESIA:   local and general  DRAINS: none   LOCAL MEDICATIONS USED:  BUPIVICAINE  and LIDOCAINE    SPECIMEN:  Source of Specimen:  left liver mass  DISPOSITION OF SPECIMEN:  PATHOLOGY  COUNTS:  YES  DICTATION: .Dragon Dictation  PLAN OF CARE: Discharge to home after PACU  PATIENT DISPOSITION:  PACU - hemodynamically stable.  FINDINGS:  exophytic mass on left lateral segment of liver, abnormal pancreas  EBL: min  PROCEDURE:  Patient was identified in the holding area and taken the operating room where he was placed supine on the operating room table.  General anesthesia was induced after sequential compression devices were placed.  The abdomen was prepped and draped in sterile fashion.  A timeout was performed according to the surgical safety checklist.  When all was correct, we continued.  The patient was placed into reverse Trendelenburg position and rotated to the right.  A Veress needle was placed at the left costal margin and the abdomen was insufflated.  4 robotic 8 mm ports were placed in curvilinear fashion across the abdomen after administration of local.  A fifth 5 mm balloon assist port was placed in the left lower quadrant.  The robot was then docked.  Rather than being an exophytic gastric mass, the mass was found to be an exophytic hepatic mass.  The vessel sealer was used to take the mass off of the left lateral segment.  This was hemostatic.  Because of the question of this being a gastric mass, the lesser sac was opened with the vessel sealer and the posterior aspect of the stomach was examined.  There was no mass on the posterior stomach, but the pancreas did appear to be abnormal.  There was not a specific mass visible, but it did appear to be calcified  and abnormal in appearance.  The mass was then grasped with a grasper and the robot was undocked.  The mass was placed into an EKOS sac and retrieved through the port site that was periumbilical in location.  The fascial incision in the skin incision both had to be enlarged in order to remove this.  The fascia was then closed using interrupted 0 Vicryl.  The abdomen was reinsufflated in order to make sure that this was airtight.  There was no residual palpable fascial defect.   A four-quadrant inspection was performed and there was no evidence of bleeding and no evidence of succus.  There was no evidence of injury to other sites.  The liver edge was reexamined and this was hemostatic.  The trocars were then removed and there was no evidence of bleeding from any of the trocar sites.  The air was suctioned out of the abdomen.  The skin of all the incisions was closed using 4-0 Monocryl in subcuticular fashion.  The skin was then cleaned, dried, and dressed with Dermabond.  The patient was allowed to emerge from anesthesia and was taken to the PACU in stable condition.  Needle, sponge, and instrument counts were correct x 2.

## 2023-10-11 NOTE — Anesthesia Postprocedure Evaluation (Signed)
 Anesthesia Post Note  Patient: Joel Kline  Procedure(s) Performed: ROBOT-ASSISTED PARTIAL HEPATECTOMY (Abdomen)     Patient location during evaluation: PACU Anesthesia Type: General Level of consciousness: awake and alert Pain management: pain level controlled Vital Signs Assessment: post-procedure vital signs reviewed and stable Respiratory status: spontaneous breathing, nonlabored ventilation, respiratory function stable and patient connected to nasal cannula oxygen Cardiovascular status: blood pressure returned to baseline and stable Postop Assessment: no apparent nausea or vomiting Anesthetic complications: no   There were no known notable events for this encounter.  Last Vitals:  Vitals:   10/11/23 1115 10/11/23 1127  BP: 125/87 125/87  Pulse: 73 73  Resp: 18 16  Temp:  36.6 C  SpO2: 95% 96%    Last Pain:  Vitals:   10/11/23 1127  TempSrc:   PainSc: 4                  Joel Kline

## 2023-10-12 ENCOUNTER — Other Ambulatory Visit: Payer: Self-pay | Admitting: General Surgery

## 2023-10-12 ENCOUNTER — Encounter (HOSPITAL_COMMUNITY): Payer: Self-pay | Admitting: General Surgery

## 2023-10-12 DIAGNOSIS — Q453 Other congenital malformations of pancreas and pancreatic duct: Secondary | ICD-10-CM

## 2023-10-12 DIAGNOSIS — Z9049 Acquired absence of other specified parts of digestive tract: Secondary | ICD-10-CM

## 2023-10-12 LAB — SURGICAL PATHOLOGY

## 2023-10-13 ENCOUNTER — Ambulatory Visit: Payer: Self-pay | Admitting: General Surgery

## 2023-10-19 DIAGNOSIS — R338 Other retention of urine: Secondary | ICD-10-CM | POA: Diagnosis not present

## 2023-10-19 DIAGNOSIS — N401 Enlarged prostate with lower urinary tract symptoms: Secondary | ICD-10-CM | POA: Diagnosis not present

## 2023-10-27 DIAGNOSIS — M546 Pain in thoracic spine: Secondary | ICD-10-CM | POA: Diagnosis not present

## 2023-10-27 DIAGNOSIS — M9904 Segmental and somatic dysfunction of sacral region: Secondary | ICD-10-CM | POA: Diagnosis not present

## 2023-10-27 DIAGNOSIS — M542 Cervicalgia: Secondary | ICD-10-CM | POA: Diagnosis not present

## 2023-10-27 DIAGNOSIS — M9901 Segmental and somatic dysfunction of cervical region: Secondary | ICD-10-CM | POA: Diagnosis not present

## 2023-10-27 DIAGNOSIS — M5388 Other specified dorsopathies, sacral and sacrococcygeal region: Secondary | ICD-10-CM | POA: Diagnosis not present

## 2023-10-27 DIAGNOSIS — M9902 Segmental and somatic dysfunction of thoracic region: Secondary | ICD-10-CM | POA: Diagnosis not present

## 2023-11-03 ENCOUNTER — Telehealth (HOSPITAL_COMMUNITY): Payer: Self-pay | Admitting: Radiology

## 2023-11-03 ENCOUNTER — Other Ambulatory Visit (HOSPITAL_COMMUNITY): Payer: Self-pay | Admitting: Urology

## 2023-11-03 DIAGNOSIS — M9904 Segmental and somatic dysfunction of sacral region: Secondary | ICD-10-CM | POA: Diagnosis not present

## 2023-11-03 DIAGNOSIS — M9902 Segmental and somatic dysfunction of thoracic region: Secondary | ICD-10-CM | POA: Diagnosis not present

## 2023-11-03 DIAGNOSIS — M542 Cervicalgia: Secondary | ICD-10-CM | POA: Diagnosis not present

## 2023-11-03 DIAGNOSIS — M546 Pain in thoracic spine: Secondary | ICD-10-CM | POA: Diagnosis not present

## 2023-11-03 DIAGNOSIS — R338 Other retention of urine: Secondary | ICD-10-CM

## 2023-11-03 DIAGNOSIS — M5388 Other specified dorsopathies, sacral and sacrococcygeal region: Secondary | ICD-10-CM | POA: Diagnosis not present

## 2023-11-03 DIAGNOSIS — M9901 Segmental and somatic dysfunction of cervical region: Secondary | ICD-10-CM | POA: Diagnosis not present

## 2023-11-03 NOTE — Telephone Encounter (Signed)
 Called patient to schedule suprapubic catheter tube placement. Left VM for him to call back. JM

## 2023-11-04 ENCOUNTER — Telehealth (HOSPITAL_COMMUNITY): Payer: Self-pay | Admitting: Radiology

## 2023-11-04 NOTE — Telephone Encounter (Signed)
 Called pt (2nd attempt) to schedule suprapubic drain placement. Left VM. JM

## 2023-11-08 DIAGNOSIS — R338 Other retention of urine: Secondary | ICD-10-CM | POA: Diagnosis not present

## 2023-11-10 DIAGNOSIS — M542 Cervicalgia: Secondary | ICD-10-CM | POA: Diagnosis not present

## 2023-11-10 DIAGNOSIS — M546 Pain in thoracic spine: Secondary | ICD-10-CM | POA: Diagnosis not present

## 2023-11-10 DIAGNOSIS — M9901 Segmental and somatic dysfunction of cervical region: Secondary | ICD-10-CM | POA: Diagnosis not present

## 2023-11-10 DIAGNOSIS — M5388 Other specified dorsopathies, sacral and sacrococcygeal region: Secondary | ICD-10-CM | POA: Diagnosis not present

## 2023-11-10 DIAGNOSIS — M9904 Segmental and somatic dysfunction of sacral region: Secondary | ICD-10-CM | POA: Diagnosis not present

## 2023-11-10 DIAGNOSIS — M9902 Segmental and somatic dysfunction of thoracic region: Secondary | ICD-10-CM | POA: Diagnosis not present

## 2023-11-11 ENCOUNTER — Ambulatory Visit
Admission: RE | Admit: 2023-11-11 | Discharge: 2023-11-11 | Disposition: A | Source: Ambulatory Visit | Attending: General Surgery | Admitting: General Surgery

## 2023-11-11 DIAGNOSIS — Z9049 Acquired absence of other specified parts of digestive tract: Secondary | ICD-10-CM

## 2023-11-11 DIAGNOSIS — I7143 Infrarenal abdominal aortic aneurysm, without rupture: Secondary | ICD-10-CM | POA: Diagnosis not present

## 2023-11-11 DIAGNOSIS — Q453 Other congenital malformations of pancreas and pancreatic duct: Secondary | ICD-10-CM

## 2023-11-11 MED ORDER — GADOPICLENOL 0.5 MMOL/ML IV SOLN
8.0000 mL | Freq: Once | INTRAVENOUS | Status: AC | PRN
  Administered 2023-11-11: 8 mL via INTRAVENOUS

## 2023-11-15 ENCOUNTER — Ambulatory Visit: Payer: Self-pay | Admitting: General Surgery

## 2023-11-15 NOTE — Progress Notes (Signed)
 Per Dr. Aron: order GI referral for EUS for pancreatic mass  This was done and turned in

## 2023-11-15 NOTE — Telephone Encounter (Signed)
 Left message on machine regarding gastroenterology referral for EUS.  We had previously discussed this possibility.

## 2023-11-17 DIAGNOSIS — M546 Pain in thoracic spine: Secondary | ICD-10-CM | POA: Diagnosis not present

## 2023-11-17 DIAGNOSIS — M9904 Segmental and somatic dysfunction of sacral region: Secondary | ICD-10-CM | POA: Diagnosis not present

## 2023-11-17 DIAGNOSIS — M542 Cervicalgia: Secondary | ICD-10-CM | POA: Diagnosis not present

## 2023-11-17 DIAGNOSIS — M5388 Other specified dorsopathies, sacral and sacrococcygeal region: Secondary | ICD-10-CM | POA: Diagnosis not present

## 2023-11-17 DIAGNOSIS — M9901 Segmental and somatic dysfunction of cervical region: Secondary | ICD-10-CM | POA: Diagnosis not present

## 2023-11-17 DIAGNOSIS — M9902 Segmental and somatic dysfunction of thoracic region: Secondary | ICD-10-CM | POA: Diagnosis not present

## 2023-11-23 ENCOUNTER — Other Ambulatory Visit: Payer: Self-pay | Admitting: Gastroenterology

## 2023-11-23 DIAGNOSIS — K219 Gastro-esophageal reflux disease without esophagitis: Secondary | ICD-10-CM | POA: Diagnosis not present

## 2023-11-23 DIAGNOSIS — R933 Abnormal findings on diagnostic imaging of other parts of digestive tract: Secondary | ICD-10-CM | POA: Diagnosis not present

## 2023-11-23 DIAGNOSIS — R1310 Dysphagia, unspecified: Secondary | ICD-10-CM | POA: Diagnosis not present

## 2023-11-23 DIAGNOSIS — C259 Malignant neoplasm of pancreas, unspecified: Secondary | ICD-10-CM | POA: Diagnosis not present

## 2023-11-24 ENCOUNTER — Encounter (HOSPITAL_COMMUNITY): Payer: Self-pay | Admitting: Gastroenterology

## 2023-11-24 DIAGNOSIS — M542 Cervicalgia: Secondary | ICD-10-CM | POA: Diagnosis not present

## 2023-11-24 DIAGNOSIS — M9903 Segmental and somatic dysfunction of lumbar region: Secondary | ICD-10-CM | POA: Diagnosis not present

## 2023-11-24 DIAGNOSIS — M546 Pain in thoracic spine: Secondary | ICD-10-CM | POA: Diagnosis not present

## 2023-11-24 DIAGNOSIS — M9902 Segmental and somatic dysfunction of thoracic region: Secondary | ICD-10-CM | POA: Diagnosis not present

## 2023-11-24 DIAGNOSIS — M9901 Segmental and somatic dysfunction of cervical region: Secondary | ICD-10-CM | POA: Diagnosis not present

## 2023-11-24 DIAGNOSIS — M5459 Other low back pain: Secondary | ICD-10-CM | POA: Diagnosis not present

## 2023-11-24 NOTE — Progress Notes (Signed)
 Attempted to obtain medical history for pre op call via telephone, unable to reach at this time. HIPAA compliant voicemail message left requesting return call to pre surgical testing department.

## 2023-11-25 ENCOUNTER — Ambulatory Visit (HOSPITAL_COMMUNITY)
Admission: RE | Admit: 2023-11-25 | Discharge: 2023-11-25 | Disposition: A | Discharge status: Home / Self Care | Attending: Gastroenterology | Admitting: Gastroenterology

## 2023-11-25 ENCOUNTER — Ambulatory Visit (HOSPITAL_COMMUNITY): Admitting: Anesthesiology

## 2023-11-25 ENCOUNTER — Ambulatory Visit (HOSPITAL_COMMUNITY): Admission: RE | Admit: 2023-11-25 | Source: Ambulatory Visit

## 2023-11-25 ENCOUNTER — Encounter (HOSPITAL_COMMUNITY): Payer: Self-pay | Admitting: Gastroenterology

## 2023-11-25 ENCOUNTER — Encounter (HOSPITAL_COMMUNITY): Admission: RE | Disposition: A | Payer: Self-pay | Attending: Gastroenterology

## 2023-11-25 ENCOUNTER — Encounter (HOSPITAL_COMMUNITY): Payer: Self-pay

## 2023-11-25 ENCOUNTER — Other Ambulatory Visit: Payer: Self-pay

## 2023-11-25 DIAGNOSIS — I2510 Atherosclerotic heart disease of native coronary artery without angina pectoris: Secondary | ICD-10-CM

## 2023-11-25 DIAGNOSIS — R1310 Dysphagia, unspecified: Secondary | ICD-10-CM | POA: Diagnosis not present

## 2023-11-25 DIAGNOSIS — K869 Disease of pancreas, unspecified: Secondary | ICD-10-CM | POA: Diagnosis present

## 2023-11-25 DIAGNOSIS — Z800 Family history of malignant neoplasm of digestive organs: Secondary | ICD-10-CM | POA: Insufficient documentation

## 2023-11-25 DIAGNOSIS — K219 Gastro-esophageal reflux disease without esophagitis: Secondary | ICD-10-CM | POA: Insufficient documentation

## 2023-11-25 DIAGNOSIS — I10 Essential (primary) hypertension: Secondary | ICD-10-CM

## 2023-11-25 DIAGNOSIS — K8689 Other specified diseases of pancreas: Secondary | ICD-10-CM | POA: Diagnosis not present

## 2023-11-25 DIAGNOSIS — R933 Abnormal findings on diagnostic imaging of other parts of digestive tract: Secondary | ICD-10-CM | POA: Diagnosis not present

## 2023-11-25 DIAGNOSIS — K859 Acute pancreatitis without necrosis or infection, unspecified: Secondary | ICD-10-CM | POA: Diagnosis not present

## 2023-11-25 DIAGNOSIS — I7121 Aneurysm of the ascending aorta, without rupture: Secondary | ICD-10-CM | POA: Insufficient documentation

## 2023-11-25 DIAGNOSIS — N289 Disorder of kidney and ureter, unspecified: Secondary | ICD-10-CM | POA: Insufficient documentation

## 2023-11-25 HISTORY — PX: EUS: SHX5427

## 2023-11-25 HISTORY — PX: ESOPHAGOGASTRODUODENOSCOPY: SHX5428

## 2023-11-25 HISTORY — PX: SAVORY DILATION: SHX5439

## 2023-11-25 HISTORY — PX: FINE NEEDLE ASPIRATION: SHX6590

## 2023-11-25 SURGERY — EGD (ESOPHAGOGASTRODUODENOSCOPY)
Anesthesia: General

## 2023-11-25 MED ORDER — SODIUM CHLORIDE 0.9 % IV SOLN
INTRAVENOUS | Status: AC | PRN
  Administered 2023-11-25: 500 mL via INTRAMUSCULAR

## 2023-11-25 MED ORDER — DEXMEDETOMIDINE HCL IN NACL 80 MCG/20ML IV SOLN
INTRAVENOUS | Status: DC | PRN
  Administered 2023-11-25 (×2): 10 ug via INTRAVENOUS

## 2023-11-25 MED ORDER — PROPOFOL 10 MG/ML IV BOLUS
INTRAVENOUS | Status: DC | PRN
  Administered 2023-11-25 (×2): 50 mg via INTRAVENOUS

## 2023-11-25 MED ORDER — PROPOFOL 500 MG/50ML IV EMUL
INTRAVENOUS | Status: DC | PRN
  Administered 2023-11-25: 150 ug/kg/min via INTRAVENOUS

## 2023-11-25 MED ORDER — LACTATED RINGERS IV SOLN: INTRAVENOUS | Status: DC | PRN

## 2023-11-25 MED ORDER — SODIUM CHLORIDE 0.9 % IV SOLN
INTRAVENOUS | Status: DC
Start: 2023-11-25 — End: 2023-11-25

## 2023-11-25 MED ORDER — LIDOCAINE 2% (20 MG/ML) 5 ML SYRINGE
INTRAMUSCULAR | Status: DC | PRN
  Administered 2023-11-25 (×2): 50 mg via INTRAVENOUS

## 2023-11-25 NOTE — Discharge Instructions (Signed)
YOU HAD AN ENDOSCOPIC PROCEDURE TODAY: Refer to the procedure report and other information in the discharge instructions given to you for any specific questions about what was found during the examination. If this information does not answer your questions, please call [facility] at [phone] to clarify.   YOU SHOULD EXPECT: Some feelings of bloating in the abdomen. Passage of more gas than usual. Walking can help get rid of the air that was put into your GI tract during the procedure and reduce the bloating.  DIET: Your first meal following the procedure should be a light meal and then it is ok to progress to your normal diet. A half-sandwich or bowl of soup is an example of a good first meal. Heavy or fried foods are harder to digest and may make you feel nauseous or bloated. Drink plenty of fluids but you should avoid alcoholic beverages for 24 hours. If you had an esophageal dilation, please see attached information for diet.   ACTIVITY: Your care partner should take you home directly after the procedure. You should plan to take it easy, moving slowly for the rest of the day. You can resume normal activity the day after the procedure however YOU SHOULD NOT DRIVE, use power tools, machinery or perform tasks that involve climbing or major physical exertion for 24 hours (because of the sedation medicines used during the test).   SYMPTOMS TO REPORT IMMEDIATELY: A gastroenterologist can be reached at any hour. Please call [phone]  for any of the following symptoms:   Following upper endoscopy (EGD, EUS, ERCP, esophageal dilation) Vomiting of blood or coffee ground material  New, significant abdominal pain  New, significant chest pain or pain under the shoulder blades  Painful or persistently difficult swallowing  New shortness of breath  Black, tarry-looking or red, bloody stools  FOLLOW UP:  If any biopsies were taken you will be contacted by phone or by letter within the next  1-3 weeks. Call [phone]  if you have not heard about the biopsies in 3 weeks.  Please also call with any specific questions about appointments or follow up tests.  

## 2023-11-25 NOTE — Transfer of Care (Signed)
 Immediate Anesthesia Transfer of Care Note  Patient: Joel Kline  Procedure(s) Performed: EGD (ESOPHAGOGASTRODUODENOSCOPY) ULTRASOUND, UPPER GI TRACT, ENDOSCOPIC FINE NEEDLE ASPIRATION EGD, WITH DILATION USING SAVARY-GILLIARD DILATOR OVER GUIDEWIRE  Patient Location: PACU  Anesthesia Type:MAC  Level of Consciousness: drowsy  Airway & Oxygen Therapy: Patient Spontanous Breathing  Post-op Assessment: Report given to RN and Post -op Vital signs reviewed and stable  Post vital signs: Reviewed and stable  Last Vitals:  Vitals Value Taken Time  BP 109/70 11/25/23 09:26  Temp 36.3 C 11/25/23 09:26  Pulse 52 11/25/23 09:27  Resp 17 11/25/23 09:27  SpO2 96 % on RA 11/25/23 09:27  Vitals shown include unfiled device data.  Last Pain:  Vitals:   11/25/23 0926  TempSrc: Temporal  PainSc: Asleep         Complications: No notable events documented.

## 2023-11-25 NOTE — Anesthesia Postprocedure Evaluation (Signed)
 Anesthesia Post Note  Patient: BRYAN GOIN  Procedure(s) Performed: EGD (ESOPHAGOGASTRODUODENOSCOPY) ULTRASOUND, UPPER GI TRACT, ENDOSCOPIC FINE NEEDLE ASPIRATION EGD, WITH DILATION USING SAVARY-GILLIARD DILATOR OVER GUIDEWIRE     Patient location during evaluation: PACU Anesthesia Type: MAC Level of consciousness: awake and alert Pain management: pain level controlled Vital Signs Assessment: post-procedure vital signs reviewed and stable Respiratory status: spontaneous breathing, nonlabored ventilation, respiratory function stable and patient connected to nasal cannula oxygen Cardiovascular status: blood pressure returned to baseline and stable Postop Assessment: no apparent nausea or vomiting Anesthetic complications: no   No notable events documented.  Last Vitals:  Vitals:   11/25/23 0930 11/25/23 0940  BP: 113/70 (!) 121/97  Pulse: (!) 57 (!) 57  Resp: 19 19  Temp:    SpO2: 94% 99%    Last Pain:  Vitals:   11/25/23 0940  TempSrc:   PainSc: 0-No pain                 Thom JONELLE Peoples

## 2023-11-25 NOTE — H&P (Signed)
 Joel Kline [facility] HPI: During a routine work up for cardiac risk stratification, in preparation for bilateral shoulder replacement, an incidental lesion was noted in the upper abdomen. Dr. Aron performed an operation to remove the presumed gastric mass, but it turned out to be an exophytic liver mass.  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  During the resection the tail of the pancreas was noted to be firm and abnormal.  An MRI was performed on 11/11/2023 and an ill-defined 4.3 cm hypovascular soft tissue mass was found in the body of the pancreas.  The mass involved the splenic artery, but the celiac axis, SMA, SMV, and portal vein were not involved.  He is now here to pursue further evaluation of his pancreatic mass.  The patient did mention some issues with intermittent dysphagia over the years in the background of some GERD.  Past Medical History:  Diagnosis Date   AKI (acute kidney injury)    Benign essential hypertension    Cellulitis of left foot 09/29/2021   Elevated PSA    Family history of colon cancer 09/02/2023   Family history of ovarian cancer 09/02/2023   Gastric mass 09/02/2023   History of kidney stones    Hx of lithotripsy 1999   Pre-diabetes    Preop cardiovascular exam 07/12/2023   Rotator cuff arthropathy of right shoulder 08/05/2023   Urinary retention 09/02/2023    Past Surgical History:  Procedure Laterality Date   FOOT SURGERY  2023   GASTRECTOMY, PARTIAL, ROBOT-ASSISTED N/A 10/11/2023   Procedure: ROBOT-ASSISTED PARTIAL HEPATECTOMY;  Surgeon: Aron Shoulders, MD;  Location: [facility] OR;  Service: General;  Laterality: N/A;  ROBOTIC PARTIAL GASTRECTOMY   INCISION AND DRAINAGE Left 09/30/2021   Procedure: INCISION AND DRAINAGE OF LEFT FOOT ABSCESS;  Surgeon: Malvin Marsa FALCON, DPM;  Location: [facility];  Service: Podiatry;  Laterality: Left;    Family History  Problem Relation Age of Onset   Ovarian cancer Mother    Colon cancer Maternal Grandmother      Social History:  reports that he has never smoked. He has never used smokeless tobacco. He reports current alcohol use. He reports that he does not currently use drugs after having used the following drugs: Marijuana.  Allergies:  Allergies  Allergen Reactions   Tramadol Dermatitis    Medications: Scheduled: Continuous:  sodium chloride       No results found for this or any previous visit (from the past 24 hours).   No results found.  ROS:  As stated above in the HPI otherwise negative.  Weight 77 kg.    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  Kue Fox D 11/25/2023, 7:25 AM

## 2023-11-25 NOTE — Anesthesia Preprocedure Evaluation (Signed)
 Anesthesia Evaluation  Patient identified by MRN, date of birth, ID band Patient awake    Reviewed: Allergy & Precautions, NPO status , Patient's Chart, lab work & pertinent test results, reviewed documented beta blocker date and time   History of Anesthesia Complications Negative for: history of anesthetic complications  Airway Mallampati: II  TM Distance: >3 FB     Dental no notable dental hx.    Pulmonary neg COPD   breath sounds clear to auscultation       Cardiovascular hypertension, + CAD  (-) Past MI, (-) Cardiac Stents and (-) CABG  Rhythm:Regular Rate:Normal  IMPRESSIONS     1. Left ventricular ejection fraction, by estimation, is 55 to 60%. The  left ventricle has normal function. The left ventricle has no regional  wall motion abnormalities. Left ventricular diastolic parameters were  normal. The average left ventricular  global longitudinal strain is -18.5 %. The global longitudinal strain is  normal.   2. Right ventricular systolic function is normal. The right ventricular  size is normal. There is normal pulmonary artery systolic pressure.   3. The mitral valve is normal in structure. Mild mitral valve  regurgitation. No evidence of mitral stenosis.   4. The aortic valve is normal in structure. Aortic valve regurgitation is  not visualized. No aortic stenosis is present.   5. Aneurysm of the ascending aorta, measuring 42 mm.   6. The inferior vena cava is normal in size with greater than 50%  respiratory variability, suggesting right atrial pressure of 3 mmHg.     Neuro/Psych neg Seizures    GI/Hepatic hiatal hernia,neg GERD  ,,(+) neg Cirrhosis      Pancreatic mass   Endo/Other    Renal/GU Renal disease     Musculoskeletal   Abdominal   Peds  Hematology   Anesthesia Other Findings   Reproductive/Obstetrics                              Anesthesia  Physical Anesthesia Plan  ASA: 3  Anesthesia Plan: General   Post-op Pain Management:    Induction: Intravenous  PONV Risk Score and Plan: 2 and Ondansetron  and Dexamethasone   Airway Management Planned: Oral ETT  Additional Equipment: None  Intra-op Plan:   Post-operative Plan: Extubation in OR  Informed Consent: I have reviewed the patients History and Physical, chart, labs and discussed the procedure including the risks, benefits and alternatives for the proposed anesthesia with the patient or authorized representative who has indicated his/her understanding and acceptance.     Dental advisory given  Plan Discussed with: CRNA  Anesthesia Plan Comments: (PAT note by Lynwood Hope, PA-C: 68 yo male follows with cardiology for hx of HTN, chronic dizziness, mild MR, mildly dilated ascending aorta 4.3 cm (on echocardiogram and cardiac PET from July 2025). Cardiac PET in July 2025 with no ischemia and normal biventricular function. 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 Last seen by Dr. Liborio 09/16/23 for preop eval. Per note, From cardiac standpoint and no ongoing active cardiac symptoms at this time. No evidence of ischemia on cardiac PET stress from July 2025. Has good baseline functional status. Continues to work 3 days a week. Proceed with his elective surgery as being planned for robotic assisted partial gastrectomy in September. No further investigations or medication changes required from cardiac standpoint. Perioperatively blood pressure management per surgical team, should be  okay to hold off amlodipine as needed.  Other pertinent hx includes renal insufficiency. Per PCP notes scanned in [facility], recent AKI 05/2023 with creatinine 2.3. Repeat CMP 07/07/23 with creatinine again elevated at 2.5. Fortunately preop labs 10/15/23 show improvement in renal function with creatinine 1.33. Remainder of preop labs  unremarkable.   Preop labs reviewed, creatinine mildly elevated 1.33, otherwise unremarkable  EKG 07/12/23: Sinus bradycardia. Rate 57. t wave inversion in leads 3 and aVF  TTE 08/05/23: 1. Left ventricular ejection fraction, by estimation, is 55 to 60%. The  left ventricle has normal function. The left ventricle has no regional  wall motion abnormalities. Left ventricular diastolic parameters were  normal. The average left ventricular  global longitudinal strain is -18.5 %. The global longitudinal strain is  normal.  2. Right ventricular systolic function is normal. The right ventricular  size is normal. There is normal pulmonary artery systolic pressure.  3. The mitral valve is normal in structure. Mild mitral valve  regurgitation. No evidence of mitral stenosis.  4. The aortic valve is normal in structure. Aortic valve regurgitation is  not visualized. No aortic stenosis is present.  5. Aneurysm of the ascending aorta, measuring 42 mm.  6. The inferior vena cava is normal in size with greater than 50%  respiratory variability, suggesting right atrial pressure of 3 mmHg.   Cardiac PET CT 07/26/23:   The study is normal. The study is low risk.   LV perfusion is normal.   Rest left ventricular function is normal. Rest EF: 50%. Stress left ventricular function is normal. Stress EF: 57%. End diastolic cavity size is normal. End systolic cavity size is normal.   Myocardial blood flow was computed to be 0.28ml/g/min at rest and 1.50ml/g/min at stress. Global myocardial blood flow reserve was 2.17 and was normal.   Coronary calcium was present on the attenuation correction CT images. Moderate coronary calcifications were present. Coronary calcifications were present in the left anterior descending artery distribution(s).   Electronically signed by Jerel Balding, MD  )         Anesthesia Quick Evaluation

## 2023-11-25 NOTE — Op Note (Signed)
 [facility] Patient Name: Joel Kline Procedure Date: 11/25/2023 MRN: [id number] Attending MD: Belvie Just , MD, [id number] Date of Birth: 11/07/1955 CSN: [id number] Age: 68 Admit Type: Inpatient Procedure:                Upper EUS Indications:              Suspected mass in pancreas on CT scan Providers:                Belvie Just, MD, Hoy Penner, RN, Felice Sar,                            Technician Referring MD:              Medicines:                Propofol  per Anesthesia Complications:            No immediate complications. Estimated Blood Loss:     Estimated blood loss: none. Procedure:                Pre-Anesthesia Assessment:                           - Prior to the procedure, a History and Physical                            was performed, and patient medications and                            allergies were reviewed. The patient's tolerance of                            previous anesthesia was also reviewed. The risks                            and benefits of the procedure and the sedation                            options and risks were discussed with the patient.                            All questions were answered, and informed consent                            was obtained. Prior Anticoagulants: The patient has                            taken no anticoagulant or antiplatelet agents. ASA                            Grade Assessment: III - A patient with severe                            systemic disease. After reviewing the risks and  benefits, the patient was deemed in satisfactory                            condition to undergo the procedure.                           - Sedation was administered by an anesthesia                            professional. Deep sedation was attained.                           After obtaining informed consent, the endoscope was                            passed under direct vision.  Throughout the                            procedure, the patient's blood pressure, pulse, and                            oxygen saturations were monitored continuously. The                            [id number] ([id number]) Olympus endosonoscope was                            introduced through the mouth, and advanced to the                            second part of duodenum. The upper EUS was                            accomplished without difficulty. The patient                            tolerated the procedure well. Scope In: Scope Out: Findings:      ENDOSCOPIC FINDING: :      No endoscopic abnormality was evident in the esophagus to explain the       patient's complaint of dysphagia. It was decided, however, to proceed       with dilation of the entire esophagus. A guidewire was placed and the       scope was withdrawn. Dilation was performed with a Savary dilator with       no resistance at 16 mm. The dilation site was examined following       endoscope reinsertion and showed no change.      The entire examined stomach was endoscopically normal.      The examined duodenum was endoscopically normal.      ENDOSONOGRAPHIC FINDING: :      A round mass was identified in the pancreatic body. The mass was       hypoechoic. The mass measured 28 mm by 29 mm in maximal cross-sectional       diameter. The endosonographic borders were well-defined. Fine needle       aspiration for  cytology was performed. Color Doppler imaging was       utilized prior to needle puncture to confirm a lack of significant       vascular structures within the needle path. Six passes were made with       the 22 gauge needle using a transgastric approach. A stylet was used. A       cytotechnologist was present to evaluate the adequacy of the specimen.       The cellularity of the specimen was adequate. Final cytology results are       pending.      In the pancreatic body, near the genu, there was a 28 mm x 29 mm        hypoechoic lesion. There was no evidence of any vascular invasion or       lymphadenopathy. A total of 6 passes were performed. The last two passes       were for cell block. After the EUS, the patient underwent an esophageal       dilation for his complaints of dysphagia. Subjectively it appeared that       the distal esophagus was spasmed. There was no change after the 16 mm       Savary dilation. No resistance was encountered. Impression:               - No endoscopic esophageal abnormality to explain                            patient's dysphagia. Esophagus dilated. Dilated.                           - Normal stomach.                           - Normal examined duodenum.                           - A mass was identified in the pancreatic body.                            Fine needle aspiration performed. Moderate Sedation:      Not Applicable - Patient had care per Anesthesia. Recommendation:           - Patient has a contact number available for                            emergencies. The signs and symptoms of potential                            delayed complications were discussed with the                            patient. Return to normal activities tomorrow.                            Written discharge instructions were provided to the                            patient.                           -  Resume regular diet.                           - Await cytology results.                           - Check IgG4. Procedure Code(s):        --- Professional ---                           [phone], Esophagogastroduodenoscopy, flexible,                            transoral; with transendoscopic ultrasound-guided                            intramural or transmural fine needle                            aspiration/biopsy(s), (includes endoscopic                            ultrasound examination limited to the esophagus,                            stomach or duodenum, and adjacent  structures)                           [id number], Esophagogastroduodenoscopy, flexible,                            transoral; with insertion of guide wire followed by                            passage of dilator(s) through esophagus over guide                            wire Diagnosis Code(s):        --- Professional ---                           K86.89, Other specified diseases of pancreas                           R13.10, Dysphagia, unspecified                           R93.3, Abnormal findings on diagnostic imaging of                            other parts of digestive tract CPT copyright 2022 [employer]. All rights reserved. The codes documented in this report are preliminary and upon coder review may  be revised to meet current compliance requirements. Belvie Just, MD Belvie Just, MD 11/25/2023 9:31:38 AM This report has been signed electronically. Number of Addenda: 0

## 2023-11-26 ENCOUNTER — Encounter (HOSPITAL_COMMUNITY): Payer: Self-pay | Admitting: Gastroenterology

## 2023-11-26 LAB — IGG 4: IgG, Subclass 4: 104 mg/dL — ABNORMAL HIGH (ref 2–96)

## 2023-11-29 LAB — CYTOLOGY - NON PAP

## 2023-12-05 ENCOUNTER — Other Ambulatory Visit (HOSPITAL_COMMUNITY): Payer: Self-pay | Admitting: Urology

## 2023-12-05 ENCOUNTER — Encounter (HOSPITAL_COMMUNITY): Payer: Self-pay

## 2023-12-05 ENCOUNTER — Ambulatory Visit (HOSPITAL_COMMUNITY)
Admission: RE | Admit: 2023-12-05 | Discharge: 2023-12-05 | Disposition: A | Discharge status: Home / Self Care | Source: Ambulatory Visit | Attending: Urology | Admitting: Urology

## 2023-12-05 ENCOUNTER — Other Ambulatory Visit: Payer: Self-pay | Admitting: Student

## 2023-12-05 ENCOUNTER — Other Ambulatory Visit: Payer: Self-pay

## 2023-12-05 DIAGNOSIS — R339 Retention of urine, unspecified: Secondary | ICD-10-CM | POA: Diagnosis not present

## 2023-12-05 DIAGNOSIS — R338 Other retention of urine: Secondary | ICD-10-CM

## 2023-12-05 LAB — CBC
HCT: 46.4 % (ref 39.0–52.0)
Hemoglobin: 15.1 g/dL (ref 13.0–17.0)
MCH: 30.3 pg (ref 26.0–34.0)
MCHC: 32.5 g/dL (ref 30.0–36.0)
MCV: 93 fL (ref 80.0–100.0)
Platelets: 249 K/uL (ref 150–400)
RBC: 4.99 MIL/uL (ref 4.22–5.81)
RDW: 13.1 % (ref 11.5–15.5)
WBC: 9.6 K/uL (ref 4.0–10.5)
nRBC: 0 % (ref 0.0–0.2)

## 2023-12-05 LAB — PROTIME-INR
INR: 1 (ref 0.8–1.2)
Prothrombin Time: 13.8 s (ref 11.4–15.2)

## 2023-12-05 MED ORDER — SODIUM CHLORIDE 0.9 % IV SOLN: INTRAVENOUS | Status: DC

## 2023-12-05 MED ORDER — FENTANYL CITRATE (PF) 100 MCG/2ML IJ SOLN
INTRAMUSCULAR | Status: AC | PRN
  Administered 2023-12-05: 50 ug via INTRAVENOUS

## 2023-12-05 MED ORDER — MIDAZOLAM HCL (PF) 2 MG/2ML IJ SOLN
INTRAMUSCULAR | Status: AC | PRN
  Administered 2023-12-05: 1 mg via INTRAVENOUS

## 2023-12-05 MED ORDER — CEFAZOLIN SODIUM-DEXTROSE 2-4 GM/100ML-% IV SOLN
INTRAVENOUS | Status: AC
  Filled 2023-12-05: qty 100

## 2023-12-05 MED ORDER — SODIUM CHLORIDE 0.9 % IV SOLN
INTRAVENOUS | Status: AC
  Filled 2023-12-05: qty 500

## 2023-12-05 MED ORDER — MIDAZOLAM HCL 2 MG/2ML IJ SOLN
INTRAMUSCULAR | Status: AC
Start: 2023-12-05 — End: 2023-12-05
  Filled 2023-12-05: qty 2

## 2023-12-05 MED ORDER — CEFAZOLIN SODIUM-DEXTROSE 2-4 GM/100ML-% IV SOLN
INTRAVENOUS | Status: AC | PRN
  Administered 2023-12-05: 2 g via INTRAVENOUS

## 2023-12-05 MED ORDER — FENTANYL CITRATE (PF) 100 MCG/2ML IJ SOLN
INTRAMUSCULAR | Status: AC
  Filled 2023-12-05: qty 2

## 2023-12-05 MED ORDER — MIDAZOLAM HCL 2 MG/2ML IJ SOLN
INTRAMUSCULAR | Status: AC
  Filled 2023-12-05: qty 2

## 2023-12-05 NOTE — H&P (Signed)
 Chief Complaint: Patient was seen in consultation today for urinary retention  Referring Physician(s): Bell,Eugene D III  Supervising Physician: Luverne Aran  Patient Status: [facility] - Out-pt  History of Present Illness: Joel Kline is a 68 y.o. male with history of urinary retention with foley catheter in place.  He has not been able to wean off the foley with adequate voiding and is referred to [facility] for suprapubic catheter placement at the request of Dr. Carolee.   Patient presents today with foley in place.  Yellow urine visible in bag.  He is aware of goal for SPT placement and eventual removal of foley catheter.   He has been NPO.  He does not take blood thinners.   He is FULL CODE for the procedure today.   Past Medical History:  Diagnosis Date   AKI (acute kidney injury)    Benign essential hypertension    Cellulitis of left foot 09/29/2021   Elevated PSA    Family history of colon cancer 09/02/2023   Family history of ovarian cancer 09/02/2023   Gastric mass 09/02/2023   History of kidney stones    Hx of lithotripsy 1999   Pre-diabetes    Preop cardiovascular exam 07/12/2023   Rotator cuff arthropathy of right shoulder 08/05/2023   Urinary retention 09/02/2023    Past Surgical History:  Procedure Laterality Date   ESOPHAGOGASTRODUODENOSCOPY N/A 11/25/2023   Procedure: EGD (ESOPHAGOGASTRODUODENOSCOPY);  Surgeon: Rollin Dover, MD;  Location: [facility];  Service: Gastroenterology;  Laterality: N/A;   EUS N/A 11/25/2023   Procedure: ULTRASOUND, UPPER GI TRACT, ENDOSCOPIC;  Surgeon: Rollin Dover, MD;  Location: [facility];  Service: Gastroenterology;  Laterality: N/A;   FINE NEEDLE ASPIRATION  11/25/2023   Procedure: FINE NEEDLE ASPIRATION;  Surgeon: Rollin Dover, MD;  Location: [facility];  Service: Gastroenterology;;   FOOT SURGERY  2023   GASTRECTOMY, PARTIAL, ROBOT-ASSISTED N/A 10/11/2023   Procedure: ROBOT-ASSISTED PARTIAL HEPATECTOMY;   Surgeon: Aron Shoulders, MD;  Location: [facility];  Service: General;  Laterality: N/A;  ROBOTIC PARTIAL GASTRECTOMY   INCISION AND DRAINAGE Left 09/30/2021   Procedure: INCISION AND DRAINAGE OF LEFT FOOT ABSCESS;  Surgeon: Malvin Marsa FALCON, DPM;  Location: [facility];  Service: Podiatry;  Laterality: Left;   SAVORY DILATION N/A 11/25/2023   Procedure: EGD, WITH DILATION USING SAVARY-GILLIARD DILATOR OVER GUIDEWIRE;  Surgeon: Rollin Dover, MD;  Location: [facility];  Service: Gastroenterology;  Laterality: N/A;    Allergies: Tramadol  Medications: Prior to Admission medications   Medication Sig Start Date End Date Taking? Authorizing Provider  amLODipine (NORVASC) 2.5 MG tablet Take 2.5 mg by mouth daily. 09/15/23   [provider]  baclofen (LIORESAL) 10 MG tablet Take 10 mg by mouth daily as needed for muscle spasms. 10/22/22   [provider]  calcium carbonate (TUMS EX) 750 MG chewable tablet Chew 1 tablet by mouth as needed for heartburn.    [provider]  MAGNESIUM PO Take 1 tablet by mouth daily.    [provider]  oxyCODONE  (OXY IR/ROXICODONE ) 5 MG immediate release tablet Take 1 tablet (5 mg total) by mouth every 6 (six) hours as needed for severe pain (pain score 7-10). 10/11/23   Aron Shoulders, MD  tamsulosin (FLOMAX) 0.4 MG CAPS capsule Take 0.4 mg by mouth daily. 07/15/23   [provider]  traMADol (ULTRAM) 50 MG tablet Take 50 mg by mouth 2 (two) times daily as needed for moderate pain (pain score 4-6). 09/15/23  [provider]     Family History  Problem Relation Age of Onset   Ovarian cancer Mother    Colon cancer Maternal Grandmother     Social History   Socioeconomic History   Marital status: Divorced    Spouse name: Not on file   Number of children: Not on file   Years of education: Not on file   Highest education level: Not on file  Occupational History   Not on file  Tobacco Use   Smoking status: Never    Smokeless tobacco: Never  Vaping Use   Vaping status: Never Used  Substance and Sexual Activity   Alcohol use: Yes    Comment: 1-2 beers a day   Drug use: Not Currently    Types: Marijuana    Comment: not recent   Sexual activity: Not Currently  Other Topics Concern   Not on file  Social History Narrative   Not on file   Social Drivers of Health   Financial Resource Strain: Not on file  Food Insecurity: No Food Insecurity (09/29/2021)   Hunger Vital Sign    Worried About Running Out of Food in the Last Year: Never true    Ran Out of Food in the Last Year: Never true  Transportation Needs: No Transportation Needs (09/29/2021)   PRAPARE - Administrator, Civil Service (Medical): No    Lack of Transportation (Non-Medical): No  Physical Activity: Not on file  Stress: Not on file  Social Connections: Not on file     Review of Systems: A 12 point ROS discussed and pertinent positives are indicated in the HPI above.  All other systems are negative.  Review of Systems  Constitutional:  Negative for fatigue and fever.  Respiratory:  Negative for cough and shortness of breath.   Cardiovascular:  Negative for chest pain.  Gastrointestinal:  Negative for abdominal pain, nausea and vomiting.  Musculoskeletal:  Negative for back pain.  Psychiatric/Behavioral:  Negative for behavioral problems and confusion.     Vital Signs: There were no vitals taken for this visit.  Physical Exam Vitals and nursing note reviewed.  Constitutional:      General: He is not in acute distress.    Appearance: Normal appearance. He is not ill-appearing.  Cardiovascular:     Rate and Rhythm: Normal rate and regular rhythm.  Pulmonary:     Effort: Pulmonary effort is normal. No respiratory distress.  Abdominal:     General: Abdomen is flat. There is no distension.     Palpations: Abdomen is soft.  Skin:    General: Skin is warm and dry.  Neurological:     General: No focal deficit  present.     Mental Status: He is alert and oriented to person, place, and time. Mental status is at baseline.  Psychiatric:        Mood and Affect: Mood normal.        Behavior: Behavior normal.        Thought Content: Thought content normal.        Judgment: Judgment normal.      MD Evaluation Airway: WNL Heart: WNL Abdomen: WNL Chest/ Lungs: WNL ASA  Classification: 3 Mallampati/Airway Score: Two   Imaging: MR ABDOMEN MRCP W WO CONTAST Result Date: 11/11/2023 CLINICAL DATA:  Recent partial left hepatectomy for hemangioma. Pancreatic abnormality. EXAM: MRI ABDOMEN WITHOUT AND WITH CONTRAST (INCLUDING MRCP) TECHNIQUE: Multiplanar multisequence MR imaging of the abdomen was performed both before and  after the administration of intravenous contrast. Heavily T2-weighted images of the biliary and pancreatic ducts were obtained, and three-dimensional MRCP images were rendered by post processing. CONTRAST:  8 mL Vueway COMPARISON:  08/02/2023 FINDINGS: Lower chest: No acute findings. Hepatobiliary: Previously seen T2 hyperintense mass in the left upper quadrant along the margin of the left hepatic lobe is no longer seen on today's exam. No other hepatic masses identified. No abnormal fluid collections are seen. Gallbladder is unremarkable. No evidence of biliary ductal dilatation. Pancreas: Atrophy of the pancreatic tail is seen with mild ductal dilatation. An ill-defined hypovascular soft tissue mass is seen in the pancreatic body measuring 4.3 x 3.3 cm on image 41/16, highly suspicious for pancreatic carcinoma. This mass appears to involve the splenic artery, but does not involve the celiac axis, SMA, SMV, or portal vein. No peripancreatic fluid collections are seen. Spleen:  Within normal limits in size and appearance. Adrenals/Urinary Tract: No suspicious masses identified. A few tiny benign-appearing left renal cysts are noted (no followup imaging is recommended). No evidence of  hydronephrosis. Stomach/Bowel: Unremarkable. Vascular/Lymphatic: No pathologically enlarged lymph nodes identified. Infrarenal abdominal aortic aneurysm is seen measuring 3.3 cm. Other:  None. Musculoskeletal:  No suspicious bone lesions identified. 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 This mass involves the splenic artery, but does not involve the celiac axis, SMA, SMV, or portal vein. No evidence of abdominal metastatic disease. Previously seen mass in the left upper quadrant along the margin of the left hepatic lobe is no longer seen on today's exam. 3.3 cm infrarenal abdominal aortic aneurysm. Recommend follow-up ultrasound every 3 years. Electronically Signed   By: Norleen DELENA Kil M.D.   On: 11/11/2023 17:49    Labs:  CBC: Recent Labs    10/05/23 1037 12/05/23 1300  WBC 10.5 9.6  HGB 15.2 15.1  HCT 46.7 46.4  PLT 252 249    COAGS: No results for input(s): INR, APTT in the last 8760 hours.  BMP: Recent Labs    10/05/23 1037  NA 136  K 3.7  CL 103  CO2 24  GLUCOSE 107*  BUN 14  CALCIUM 9.6  CREATININE 1.33*  GFRNONAA 59*    LIVER FUNCTION TESTS: Recent Labs    10/05/23 1037  BILITOT 0.8  AST 18  ALT 17  ALKPHOS 68  PROT 7.9  ALBUMIN 4.2    TUMOR MARKERS: No results for input(s): AFPTM, CEA, CA199, CHROMGRNA in the last 8760 hours.  Assessment and Plan: Patient with past medical history of urinary retention presents for suprapubic catheter placement at the request of Dr. Carolee.  Case reviewed by Dr. Luverne who approves patient for procedure.  Patient presents today in their usual state of health.  He has been NPO and is not currently on blood thinners.   Risks and benefits discussed with the patient including bleeding, infection, damage to adjacent structures, bowel perforation/fistula connection, and sepsis.  All of the patient's questions were answered,  patient is agreeable to proceed. Consent signed and in chart.    Thank you for this interesting consult.  I greatly enjoyed meeting Joel Kline and look forward to participating in their care.  A copy of this report was sent to the requesting provider on this date.  Electronically Signed: Ercell Perlman Sue-Ellen Norberto Wishon, PA 12/05/2023, 1:33 PM   I spent a total of  30 Minutes   in face to face in clinical consultation, greater than 50%  of which was counseling/coordinating care for urinary retention.

## 2023-12-05 NOTE — Discharge Instructions (Signed)
 Please call Interventional Radiology clinic [phone] with any questions or concerns.  You may remove your dressing and shower tomorrow.  After the procedure, it is common to have Discomfort around the opening in your abdomen Blood in the urine is normal and will improve  Follow these instructions at home:  Medication: Do not use Aspirin or ibuprofen  products, such as Advil  or Motrin , as it may increase bleeding.  You may resume your usual medications as ordered by your doctor If your doctor prescribed antibiotics, take them as directed. Do not stop taking them just because you feel better. You need to take the full course of antibiotics  Eating and drinking: Drink plenty of liquids to keep your urine pale yellow You can resume your regular diet as directed by your doctor   Care of the procedure site Always wash your hands for at least 20 seconds before and after caring for your catheter and collection bag. Use a mild, fragrance-free soap. If soap and water  are not available, use hand sanitizer. Clean the outside of the catheter with soap and water  as often as told by your provider Always make sure there are no twists or kinks in the catheter tube Always make sure there are no leaks in the catheter or collection bag Always wear the leg bag below your knee Make sure the overnight drainage bag is always lower than the level of your bladder Do not let the bag touch the floor Before you go to sleep, hang the bag inside a wastebasket that is covered by a clean plastic bag. Drink enough fluid to keep your pee pale yellow  Activity Do not take baths, swim, or use a hot tub until your health care provider approves. Take showers only Keep all follow-up visits as told by your doctor  Contact your doctor or seek immediate medical care if: You have any signs of infection around your catheter You have a fever or chills There is a change in the color or smell of your pee You have vomiting  that does not stop You have back pain You have blood in your pee You have a hard time changing your catheter You leak pee around your catheter Your pee flow slows down  Get help right away if: Your catheter comes out and you are unable to insert a new one You have no pee flow for 1 hour  Moderate Conscious Sedation-Care After  This sheet gives you information about how to care for yourself after your procedure. Your health care provider may also give you more specific instructions. If you have problems or questions, contact your health care provider.  After the procedure, it is common to have: Sleepiness for several hours. Impaired judgment for several hours. Difficulty with balance. Vomiting if you eat too soon.  Follow these instructions at home:  Rest. Do not participate in activities where you could fall or become injured. Do not drive or use machinery. Do not drink alcohol. Do not take sleeping pills or medicines that cause drowsiness. Do not make important decisions or sign legal documents. Do not take care of children on your own.  Eating and drinking Follow the diet recommended by your health care provider. Drink enough fluid to keep your urine pale yellow. If you vomit: Drink water , juice, or soup when you can drink without vomiting. Make sure you have little or no nausea before eating solid foods.  General instructions Take over-the-counter and prescription medicines only as told by your health care provider.  Have a responsible adult stay with you for the time you are told. It is important to have someone help care for you until you are awake and alert. Do not smoke. Keep all follow-up visits as told by your health care provider. This is important.  Contact a health care provider if: You are still sleepy or having trouble with balance after 24 hours. You feel light-headed. You keep feeling nauseous or you keep vomiting. You develop a rash. You have a  fever. You have redness or swelling around the IV site.  Get help right away if: You have trouble breathing. You have new-onset confusion at home.  This information is not intended to replace advice given to you by your health care provider. Make sure you discuss any questions you have with your healthcare provider.

## 2023-12-05 NOTE — Procedures (Signed)
 Interventional Radiology Procedure Note  Procedure: Suprapubic catheter placement  Complications: None  Estimated Blood Loss: < 5 mL  Findings: 14 Fr pigtail suprapubic catheter placed in bladder under CT guidance. Attached to gravity bag.  Plan: Upsize to 16 Fr Foley catheter in 4-6 weeks.  Marcey DASEN. Luverne, M.D Pager:  [phone]

## 2023-12-05 NOTE — Progress Notes (Signed)
 Paged [facility] for procedure orders

## 2023-12-06 ENCOUNTER — Other Ambulatory Visit (HOSPITAL_COMMUNITY): Payer: Self-pay | Admitting: Interventional Radiology

## 2023-12-06 ENCOUNTER — Other Ambulatory Visit: Payer: Self-pay

## 2023-12-06 DIAGNOSIS — R339 Retention of urine, unspecified: Secondary | ICD-10-CM

## 2023-12-07 ENCOUNTER — Encounter (HOSPITAL_COMMUNITY): Payer: Self-pay | Admitting: Radiology

## 2023-12-07 ENCOUNTER — Ambulatory Visit (HOSPITAL_COMMUNITY)
Admission: RE | Admit: 2023-12-07 | Discharge: 2023-12-07 | Disposition: A | Discharge status: Home / Self Care | Source: Ambulatory Visit | Attending: Interventional Radiology | Admitting: Interventional Radiology

## 2023-12-07 DIAGNOSIS — R339 Retention of urine, unspecified: Secondary | ICD-10-CM

## 2023-12-07 HISTORY — PX: IR PATIENT EVAL TECH 0-60 MINS: IMG5564

## 2023-12-07 NOTE — Procedures (Signed)
 Joel Kline came today needing a way to change his urine bag out in the evening to a larger d=bay for overnight use.He given extension tubing to attach to the large bag for overnight use.  He was instructed on how to change the bag out for day and overnight use.

## 2023-12-15 DIAGNOSIS — M5459 Other low back pain: Secondary | ICD-10-CM | POA: Diagnosis not present

## 2023-12-15 DIAGNOSIS — M542 Cervicalgia: Secondary | ICD-10-CM | POA: Diagnosis not present

## 2023-12-15 DIAGNOSIS — M9901 Segmental and somatic dysfunction of cervical region: Secondary | ICD-10-CM | POA: Diagnosis not present

## 2023-12-15 DIAGNOSIS — M9903 Segmental and somatic dysfunction of lumbar region: Secondary | ICD-10-CM | POA: Diagnosis not present

## 2023-12-15 DIAGNOSIS — M9902 Segmental and somatic dysfunction of thoracic region: Secondary | ICD-10-CM | POA: Diagnosis not present

## 2023-12-15 DIAGNOSIS — M546 Pain in thoracic spine: Secondary | ICD-10-CM | POA: Diagnosis not present

## 2023-12-16 ENCOUNTER — Inpatient Hospital Stay

## 2023-12-16 DIAGNOSIS — I10 Essential (primary) hypertension: Secondary | ICD-10-CM | POA: Diagnosis not present

## 2023-12-16 DIAGNOSIS — K3189 Other diseases of stomach and duodenum: Secondary | ICD-10-CM | POA: Diagnosis not present

## 2023-12-16 DIAGNOSIS — K219 Gastro-esophageal reflux disease without esophagitis: Secondary | ICD-10-CM | POA: Diagnosis not present

## 2023-12-16 DIAGNOSIS — R7303 Prediabetes: Secondary | ICD-10-CM | POA: Diagnosis not present

## 2023-12-16 DIAGNOSIS — M1990 Unspecified osteoarthritis, unspecified site: Secondary | ICD-10-CM | POA: Diagnosis not present

## 2023-12-16 DIAGNOSIS — R339 Retention of urine, unspecified: Secondary | ICD-10-CM | POA: Diagnosis not present

## 2023-12-16 DIAGNOSIS — N1831 Chronic kidney disease, stage 3a: Secondary | ICD-10-CM | POA: Diagnosis not present

## 2023-12-16 DIAGNOSIS — R252 Cramp and spasm: Secondary | ICD-10-CM | POA: Diagnosis not present

## 2023-12-16 DIAGNOSIS — N1330 Unspecified hydronephrosis: Secondary | ICD-10-CM | POA: Diagnosis not present

## 2023-12-16 DIAGNOSIS — Z6824 Body mass index (BMI) 24.0-24.9, adult: Secondary | ICD-10-CM | POA: Diagnosis not present

## 2023-12-29 ENCOUNTER — Telehealth (HOSPITAL_BASED_OUTPATIENT_CLINIC_OR_DEPARTMENT_OTHER): Payer: Self-pay | Admitting: *Deleted

## 2023-12-29 ENCOUNTER — Telehealth (HOSPITAL_BASED_OUTPATIENT_CLINIC_OR_DEPARTMENT_OTHER): Payer: Self-pay

## 2023-12-29 DIAGNOSIS — M461 Sacroiliitis, not elsewhere classified: Secondary | ICD-10-CM | POA: Diagnosis not present

## 2023-12-29 DIAGNOSIS — M9901 Segmental and somatic dysfunction of cervical region: Secondary | ICD-10-CM | POA: Diagnosis not present

## 2023-12-29 DIAGNOSIS — M9904 Segmental and somatic dysfunction of sacral region: Secondary | ICD-10-CM | POA: Diagnosis not present

## 2023-12-29 DIAGNOSIS — M5412 Radiculopathy, cervical region: Secondary | ICD-10-CM | POA: Diagnosis not present

## 2023-12-29 DIAGNOSIS — M546 Pain in thoracic spine: Secondary | ICD-10-CM | POA: Diagnosis not present

## 2023-12-29 DIAGNOSIS — M9902 Segmental and somatic dysfunction of thoracic region: Secondary | ICD-10-CM | POA: Diagnosis not present

## 2023-12-29 NOTE — Telephone Encounter (Signed)
   Pre-operative Risk Assessment    Patient Name: Joel Kline  DOB: 1955/09/05 MRN: [id number]   Date of last office visit: 09/16/23 with Madireddy Date of next office visit: NA  Request for Surgical Clearance    Procedure:  Lt Reverse Shoulder Arthroplasty   Date of Surgery:  Clearance 02/17/24                                 Surgeon:  Dr. Melita Surgeon's Group or Practice Name:  [facility] Phone number:  [phone] Fax number:  [phone]   Type of Clearance Requested:   - Medical    Type of Anesthesia:  General    Additional requests/questions:    SignedAugustin JONETTA Daring   12/29/2023, 3:08 PM

## 2023-12-29 NOTE — Telephone Encounter (Signed)
 Pt has been scheduled tele preop appt 01/30/24. Med rec and consent are done.       Patient Consent for Virtual Visit        Joel Kline has provided verbal consent on 12/29/2023 for a virtual visit (video or telephone).   CONSENT FOR VIRTUAL VISIT FOR:  Joel Kline  By participating in this virtual visit I agree to the following:  I hereby voluntarily request, consent and authorize [facility] and its employed or contracted physicians, physician assistants, nurse practitioners or other licensed health care professionals (the Practitioner), to provide me with telemedicine health care services (the "Services) as deemed necessary by the treating Practitioner. I acknowledge and consent to receive the Services by the Practitioner via telemedicine. I understand that the telemedicine visit will involve communicating with the Practitioner through live audiovisual communication technology and the disclosure of certain medical information by electronic transmission. I acknowledge that I have been given the opportunity to request an in-person assessment or other available alternative prior to the telemedicine visit and am voluntarily participating in the telemedicine visit.  I understand that I have the right to withhold or withdraw my consent to the use of telemedicine in the course of my care at any time, without affecting my right to future care or treatment, and that the Practitioner or I may terminate the telemedicine visit at any time. I understand that I have the right to inspect all information obtained and/or recorded in the course of the telemedicine visit and may receive copies of available information for a reasonable fee.  I understand that some of the potential risks of receiving the Services via telemedicine include:  Delay or interruption in medical evaluation due to technological equipment failure or disruption; Information transmitted may not be sufficient (e.g. poor  resolution of images) to allow for appropriate medical decision making by the Practitioner; and/or  In rare instances, security protocols could fail, causing a breach of personal health information.  Furthermore, I acknowledge that it is my responsibility to provide information about my medical history, conditions and care that is complete and accurate to the best of my ability. I acknowledge that Practitioner's advice, recommendations, and/or decision may be based on factors not within their control, such as incomplete or inaccurate data provided by me or distortions of diagnostic images or specimens that may result from electronic transmissions. I understand that the practice of medicine is not an exact science and that Practitioner makes no warranties or guarantees regarding treatment outcomes. I acknowledge that a copy of this consent can be made available to me via my patient portal [facility] MyChart), or I can request a printed copy by calling the office of [facility].    I understand that my insurance will be billed for this visit.   I have read or had this consent read to me. I understand the contents of this consent, which adequately explains the benefits and risks of the Services being provided via telemedicine.  I have been provided ample opportunity to ask questions regarding this consent and the Services and have had my questions answered to my satisfaction. I give my informed consent for the services to be provided through the use of telemedicine in my medical care

## 2023-12-29 NOTE — Telephone Encounter (Signed)
   Name: Joel Kline  DOB: 11/30/1955  MRN: [id number]  Primary Cardiologist: Madireddy   Preoperative team, please contact this patient and set up a phone call appointment for further preoperative risk assessment. Please obtain consent and complete medication review. Thank you for your help.  I confirm that guidance regarding antiplatelet and oral anticoagulation therapy has been completed and, if necessary, noted below.  I also confirmed the patient resides in the state of  . As per [facility] [employer] telemedicine laws, the patient must reside in the state in which the provider is licensed.   [name], NP 12/29/2023, 3:26 PM [facility]

## 2023-12-29 NOTE — Telephone Encounter (Signed)
 Pt has been scheduled tele preop appt 01/30/24. Med rec and consent are done.

## 2023-12-30 ENCOUNTER — Inpatient Hospital Stay

## 2023-12-30 ENCOUNTER — Inpatient Hospital Stay: Admitting: Genetic Counselor

## 2024-01-09 ENCOUNTER — Other Ambulatory Visit: Payer: Self-pay | Admitting: General Surgery

## 2024-01-09 DIAGNOSIS — Q453 Other congenital malformations of pancreas and pancreatic duct: Secondary | ICD-10-CM

## 2024-01-09 DIAGNOSIS — Z8041 Family history of malignant neoplasm of ovary: Secondary | ICD-10-CM

## 2024-01-09 DIAGNOSIS — Z9889 Other specified postprocedural states: Secondary | ICD-10-CM

## 2024-01-09 DIAGNOSIS — Z800 Family history of malignant neoplasm of digestive organs: Secondary | ICD-10-CM

## 2024-01-11 ENCOUNTER — Other Ambulatory Visit: Payer: Self-pay | Admitting: Radiology

## 2024-01-11 DIAGNOSIS — R339 Retention of urine, unspecified: Secondary | ICD-10-CM

## 2024-01-12 NOTE — H&P (Signed)
 Chief Complaint: Urinary retention, status post suprapubic catheter placement on 12/05/23; referred now for image guided suprapubic catheter exchange and up sizing to a 16 French Council Foley catheter.  Referring Provider(s): Bell,E  Supervising Physician: Philip Cornet  Patient Status: [facility] - Out-pt  History of Present Illness: Joel Kline is a 68 y.o. male with past medical history significant for acute kidney injury, hypertension, elevated PSA, nephrolithiasis, prediabetes, and urinary retention with recent suprapubic catheter placement on 12/05/2023.  He presents again today for image guided suprapubic catheter exchange and upsizing to a 16 French council Foley catheter.   *** Patient is Full Code  Past Medical History:  Diagnosis Date   AKI (acute kidney injury)    Benign essential hypertension    Cellulitis of left foot 09/29/2021   Elevated PSA    Family history of colon cancer 09/02/2023   Family history of ovarian cancer 09/02/2023   Gastric mass 09/02/2023   History of kidney stones    Hx of lithotripsy 1999   Pre-diabetes    Preop cardiovascular exam 07/12/2023   Rotator cuff arthropathy of right shoulder 08/05/2023   Urinary retention 09/02/2023    Past Surgical History:  Procedure Laterality Date   ESOPHAGOGASTRODUODENOSCOPY N/A 11/25/2023   Procedure: EGD (ESOPHAGOGASTRODUODENOSCOPY);  Surgeon: Rollin Dover, MD;  Location: [facility];  Service: Gastroenterology;  Laterality: N/A;   EUS N/A 11/25/2023   Procedure: ULTRASOUND, UPPER GI TRACT, ENDOSCOPIC;  Surgeon: Rollin Dover, MD;  Location: [facility];  Service: Gastroenterology;  Laterality: N/A;   FINE NEEDLE ASPIRATION  11/25/2023   Procedure: FINE NEEDLE ASPIRATION;  Surgeon: Rollin Dover, MD;  Location: [facility];  Service: Gastroenterology;;   FOOT SURGERY  2023   GASTRECTOMY, PARTIAL, ROBOT-ASSISTED N/A 10/11/2023   Procedure: ROBOT-ASSISTED PARTIAL HEPATECTOMY;  Surgeon: Aron Shoulders, MD;   Location: [facility];  Service: General;  Laterality: N/A;  ROBOTIC PARTIAL GASTRECTOMY   INCISION AND DRAINAGE Left 09/30/2021   Procedure: INCISION AND DRAINAGE OF LEFT FOOT ABSCESS;  Surgeon: Malvin Marsa FALCON, DPM;  Location: [facility];  Service: Podiatry;  Laterality: Left;   IR PATIENT EVAL TECH 0-60 MINS  12/07/2023   SAVORY DILATION N/A 11/25/2023   Procedure: EGD, WITH DILATION USING SAVARY-GILLIARD DILATOR OVER GUIDEWIRE;  Surgeon: Rollin Dover, MD;  Location: [facility];  Service: Gastroenterology;  Laterality: N/A;    Allergies: Tramadol  Medications: Prior to Admission medications  Medication Sig Start Date End Date Taking? Authorizing Provider  amLODipine (NORVASC) 2.5 MG tablet Take 2.5 mg by mouth daily. 09/15/23   [provider]  baclofen (LIORESAL) 10 MG tablet Take 10 mg by mouth daily as needed for muscle spasms. 10/22/22   [provider]  calcium carbonate (TUMS EX) 750 MG chewable tablet Chew 1 tablet by mouth as needed for heartburn.    [provider]  MAGNESIUM PO Take 1 tablet by mouth daily.    [provider]  oxyCODONE  (OXY IR/ROXICODONE ) 5 MG immediate release tablet Take 1 tablet (5 mg total) by mouth every 6 (six) hours as needed for severe pain (pain score 7-10). 10/11/23   Aron Shoulders, MD  tamsulosin (FLOMAX) 0.4 MG CAPS capsule Take 0.4 mg by mouth daily. 07/15/23   [provider]  traMADol (ULTRAM) 50 MG tablet Take 50 mg by mouth 2 (two) times daily as needed for moderate pain (pain score 4-6). 09/15/23   [provider]     Family History  Problem Relation Age of Onset   Ovarian  cancer Mother    Colon cancer Maternal Grandmother     Social History   Socioeconomic History   Marital status: Divorced    Spouse name: Not on file   Number of children: Not on file   Years of education: Not on file   Highest education level: Not on file  Occupational History   Not on file  Tobacco Use    Smoking status: Never   Smokeless tobacco: Never  Vaping Use   Vaping status: Never Used  Substance and Sexual Activity   Alcohol use: Yes    Comment: 1-2 beers a day   Drug use: Not Currently    Types: Marijuana    Comment: not recent   Sexual activity: Not Currently  Other Topics Concern   Not on file  Social History Narrative   Not on file   Social Drivers of Health   Tobacco Use: Low Risk (12/05/2023)   Patient History    Smoking Tobacco Use: Never    Smokeless Tobacco Use: Never    Passive Exposure: Not on file  Financial Resource Strain: Not on file  Food Insecurity: No Food Insecurity (09/29/2021)   Hunger Vital Sign    Worried About Running Out of Food in the Last Year: Never true    Ran Out of Food in the Last Year: Never true  Transportation Needs: No Transportation Needs (09/29/2021)   PRAPARE - Administrator, Civil Service (Medical): No    Lack of Transportation (Non-Medical): No  Physical Activity: Not on file  Stress: Not on file  Social Connections: Not on file  Depression (EYV7-0): Not on file  Alcohol Screen: Not on file  Housing: Unknown (09/02/2023)   Received from [facility]   Epic    Unable to Pay for Housing in the Last Year: Not on file    Number of Times Moved in the Last Year: Not on file    At any time in the past 12 months, were you homeless or living in a shelter (including now)?: No  Utilities: Not At Risk (09/29/2021)   [facility] Utilities    Threatened with loss of utilities: No  Health Literacy: Not on file       Review of Systems  Vital Signs:   Advance Care Plan: No documents on file  Physical Exam  Imaging: No results found.  Labs:  CBC: Recent Labs    10/05/23 1037 12/05/23 1300  WBC 10.5 9.6  HGB 15.2 15.1  HCT 46.7 46.4  PLT 252 249    COAGS: Recent Labs    12/05/23 1300  INR 1.0    BMP: Recent Labs    10/05/23 1037  NA 136  K 3.7  CL 103  CO2 24  GLUCOSE 107*  BUN 14   CALCIUM 9.6  CREATININE 1.33*  GFRNONAA 59*    LIVER FUNCTION TESTS: Recent Labs    10/05/23 1037  BILITOT 0.8  AST 18  ALT 17  ALKPHOS 68  PROT 7.9  ALBUMIN 4.2    TUMOR MARKERS: No results for input(s): AFPTM, CEA, CA199, CHROMGRNA in the last 8760 hours.  Assessment and Plan: 67 y.o. male with past medical history significant for acute kidney injury, hypertension, elevated PSA, nephrolithiasis, prediabetes, and urinary retention with recent suprapubic catheter placement on 12/05/2023.  He presents again today for image guided suprapubic catheter exchange and upsizing to a 16 French council Foley catheter.  Details/risks of procedure, including but not limited to,  internal bleeding, infection, injury to adjacent structures discussed with patient with his understanding and consent.   Thank you for allowing our service to participate in TRAVANTE KNEE 's care.  Electronically Signed: D. Franky Rakers, PA-C   01/12/2024, 2:03 PM      I spent a total of  20 minutes   in face to face in clinical consultation, greater than 50% of which was counseling/coordinating care for image guided suprapubic catheter exchange and upsizing

## 2024-01-13 ENCOUNTER — Other Ambulatory Visit: Payer: Self-pay

## 2024-01-13 ENCOUNTER — Ambulatory Visit (HOSPITAL_COMMUNITY)
Admission: RE | Admit: 2024-01-13 | Discharge: 2024-01-13 | Disposition: A | Discharge status: Home / Self Care | Source: Ambulatory Visit | Attending: Interventional Radiology | Admitting: Interventional Radiology

## 2024-01-13 ENCOUNTER — Encounter (HOSPITAL_COMMUNITY): Payer: Self-pay

## 2024-01-13 ENCOUNTER — Inpatient Hospital Stay (HOSPITAL_COMMUNITY): Admission: RE | Admit: 2024-01-13 | Discharge: 2024-01-13 | Attending: Interventional Radiology

## 2024-01-13 DIAGNOSIS — R339 Retention of urine, unspecified: Secondary | ICD-10-CM | POA: Diagnosis present

## 2024-01-13 HISTORY — PX: IR CYSTOSTOMY TUBE CHANGE COMPLICATED W IMG: IMG1098

## 2024-01-13 LAB — BASIC METABOLIC PANEL WITH GFR
Anion gap: 11 (ref 5–15)
BUN: 12 mg/dL (ref 8–23)
CO2: 24 mmol/L (ref 22–32)
Calcium: 9.6 mg/dL (ref 8.9–10.3)
Chloride: 104 mmol/L (ref 98–111)
Creatinine, Ser: 1.08 mg/dL (ref 0.61–1.24)
GFR, Estimated: >60 mL/min
Glucose, Bld: 109 mg/dL — ABNORMAL HIGH (ref 70–99)
Potassium: 4.2 mmol/L (ref 3.5–5.1)
Sodium: 139 mmol/L (ref 135–145)

## 2024-01-13 LAB — CBC WITH DIFFERENTIAL/PLATELET
Abs Immature Granulocytes: 0.03 K/uL (ref 0.00–0.07)
Basophils Absolute: 0.1 K/uL (ref 0.0–0.1)
Basophils Relative: 1 %
Eosinophils Absolute: 0.3 K/uL (ref 0.0–0.5)
Eosinophils Relative: 3 %
HCT: 45.2 % (ref 39.0–52.0)
Hemoglobin: 14.5 g/dL (ref 13.0–17.0)
Immature Granulocytes: 0 %
Lymphocytes Relative: 27 %
Lymphs Abs: 2.6 K/uL (ref 0.7–4.0)
MCH: 30 pg (ref 26.0–34.0)
MCHC: 32.1 g/dL (ref 30.0–36.0)
MCV: 93.4 fL (ref 80.0–100.0)
Monocytes Absolute: 0.8 K/uL (ref 0.1–1.0)
Monocytes Relative: 8 %
Neutro Abs: 6.1 K/uL (ref 1.7–7.7)
Neutrophils Relative %: 61 %
Platelets: 234 K/uL (ref 150–400)
RBC: 4.84 MIL/uL (ref 4.22–5.81)
RDW: 14.5 % (ref 11.5–15.5)
WBC: 9.9 K/uL (ref 4.0–10.5)
nRBC: 0 % (ref 0.0–0.2)

## 2024-01-13 MED ORDER — LIDOCAINE VISCOUS HCL 2 % MT SOLN
15.0000 mL | Freq: Once | OROMUCOSAL | Status: AC
  Administered 2024-01-13: 3 mL via OROMUCOSAL

## 2024-01-13 MED ORDER — SODIUM CHLORIDE 0.9 % IV SOLN: INTRAVENOUS | Status: DC

## 2024-01-13 MED ORDER — FENTANYL CITRATE (PF) 100 MCG/2ML IJ SOLN
INTRAMUSCULAR | Status: AC
  Filled 2024-01-13: qty 2

## 2024-01-13 MED ORDER — MIDAZOLAM HCL 2 MG/2ML IJ SOLN
INTRAMUSCULAR | Status: AC
  Filled 2024-01-13: qty 4

## 2024-01-13 MED ORDER — HYDROMORPHONE HCL 1 MG/ML IJ SOLN
INTRAMUSCULAR | Status: AC | PRN
  Administered 2024-01-13: 1 mg via INTRAVENOUS

## 2024-01-13 MED ORDER — LIDOCAINE VISCOUS HCL 2 % MT SOLN
OROMUCOSAL | Status: AC
  Filled 2024-01-13: qty 15

## 2024-01-13 MED ORDER — HYDROMORPHONE HCL 1 MG/ML IJ SOLN
INTRAMUSCULAR | Status: AC
  Filled 2024-01-13: qty 1

## 2024-01-13 MED ORDER — LIDOCAINE HCL 1 % IJ SOLN
20.0000 mL | Freq: Once | INTRAMUSCULAR | Status: AC
  Administered 2024-01-13: 5 mL via INTRADERMAL

## 2024-01-13 MED ORDER — MIDAZOLAM HCL (PF) 2 MG/2ML IJ SOLN
INTRAMUSCULAR | Status: AC | PRN
  Administered 2024-01-13 (×4): 1 mg via INTRAVENOUS

## 2024-01-13 MED ORDER — IOHEXOL 300 MG/ML  SOLN
50.0000 mL | Freq: Once | INTRAMUSCULAR | Status: AC | PRN
  Administered 2024-01-13: 20 mL

## 2024-01-13 MED ORDER — LIDOCAINE HCL 1 % IJ SOLN
INTRAMUSCULAR | Status: AC
  Filled 2024-01-13: qty 20

## 2024-01-13 MED ORDER — FENTANYL CITRATE (PF) 100 MCG/2ML IJ SOLN
INTRAMUSCULAR | Status: AC | PRN
  Administered 2024-01-13 (×2): 50 ug via INTRAVENOUS

## 2024-01-13 NOTE — Discharge Instructions (Addendum)
 " Please call Interventional Radiology clinic [phone] with any questions or concerns.    Suprapubic Catheter Home Guide A suprapubic catheter is a flexible tube that is used to drain urine from the bladder into a collection bag outside the body. The catheter is inserted into the bladder through a small opening in the lower abdomen, above the pubic bone (suprapubic area) and a few inches below your belly button (navel). A tiny balloon filled with germ-free (sterile) water helps to keep the catheter in place. The collection bag must be emptied at least once a day and cleaned at least every other day. The collection bag can be put beside your bed at night and attached to your leg during the day. You may have a large collection bag to use at night and a smaller one to use during the day. Your suprapubic catheter may need to be changed every 4-6 weeks, or as often as recommended by your health care provider. Healing of the tract where the catheter is placed can take 6 weeks to 6 months. During that time, your health care provider may change your catheter. Once the tract is well healed, you or a caregiver will change your suprapubic catheter at home. What are the risks? This catheter is safe to use. However, problems can occur, including: Blocked urine flow. This can occur if the catheter stops working, or if you have a blood clot in your bladder or in the catheter. Irritation of the skin around the catheter. Infection. This can happen if bacteria gets into your bladder.   How to care for the skin around the catheter  Follow your health care provider's instructions on caring for your skin. Use a clean washcloth and soapy water to clean the skin around your catheter every day. Pat the area dry with a clean paper towel. Do not pull on the catheter. Do not use ointment or lotion on this area, unless told by your health care provider. Check the skin around the catheter every day for signs of  infection. Check for: Redness, swelling, or pain. Fluid or blood. Warmth. Pus or a bad smell. How to empty and clean the collection bag Empty the large collection bag every 8 hours. Empty the small collection bag when it is about ? full. Clean the collection bag every 2-3 days, or as often as told by your health care provider. To do this: Wash your hands with soap and water. If soap and water are not available, use hand sanitizer. Disconnect the bag from the catheter and immediately attach a new bag to the catheter. Hold the used bag over the toilet or another container. Turn the valve (spigot) at the bottom of the bag to empty the urine. Empty the used bag completely. Do not touch the opening of the spigot. Do not let the opening touch the toilet or container. Close the spigot tightly when the bag is empty. Clean the used bag in one of the following methods: According to the manufacturer's instructions. As told by your health care provider. Let the bag dry completely. Put it in a clean plastic bag before storing it. General tips  Always wash your hands before and after caring for your catheter and collection bag. Use a mild, fragrance-free soap. If soap and water are not available, use hand sanitizer. Clean the outside of the catheter with soap and water as often as told by your health care provider. Always make sure there are no twists or kinks in the catheter tube.  Always make sure there are no leaks in the catheter or collection bag. Always wear the leg bag below your knee. Make sure the overnight drainage bag is always lower than the level of your bladder, but do not let it touch the floor. Before you go to sleep, hang the bag inside a wastebasket that is covered by a clean plastic bag. Drink enough fluid to keep your urine pale yellow. Do not take baths, swim, or use a hot tub until your health care provider approves. Ask your health care provider if you may take showers. Contact a  heath care provider if: You leak urine. You have redness, swelling, or pain around your catheter. You have fluid or blood coming from your catheter opening. Your catheter opening feels warm to the touch. You have pus or a bad smell coming from your catheter opening. You have a fever or chills. Your urine flow slows down. Your urine becomes cloudy or smelly. Get help right away if: Your catheter comes out. You have: Nausea. Back pain. Difficulty changing your catheter. Blood in your urine. No urine flow for 1 hour. Summary A suprapubic catheter is a flexible tube that is used to drain urine from the bladder into a collection bag outside the body. Your suprapubic catheter may need to be changed every 4-6 weeks, or as recommended by your health care provider. Follow instructions on how to change the catheter and how to empty and clean the collection bag. Always wash your hands before and after caring for your catheter and collection bag. Drink enough fluid to keep your urine pale yellow. Get help right away if you have difficulty changing your catheter or if there is blood in your urine. This information is not intended to replace advice given to you by your health care provider. Make sure you discuss any questions you have with your health care provider. Document Revised: 03/22/2019 Document Reviewed: 02/15/2018 Elsevier Patient Education  2023 Elsevier Inc.   Moderate Conscious Sedation, Adult, Care After This sheet gives you information about how to care for yourself after your procedure. Your health care provider may also give you more specific instructions. If you have problems or questions, contact your health care provider. What can I expect after the procedure? After the procedure, it is common to have: Sleepiness for several hours. Impaired judgment for several hours. Difficulty with balance. Vomiting if you eat too soon. Follow these instructions at home: For the time  period you were told by your health care provider: Rest. Do not participate in activities where you could fall or become injured. Do not drive or use machinery. Do not drink alcohol. Do not take sleeping pills or medicines that cause drowsiness. Do not make important decisions or sign legal documents. Do not take care of children on your own.      Eating and drinking Follow the diet recommended by your health care provider. Drink enough fluid to keep your urine pale yellow. If you vomit: Drink water, juice, or soup when you can drink without vomiting. Make sure you have little or no nausea before eating solid foods.   General instructions Take over-the-counter and prescription medicines only as told by your health care provider. Have a responsible adult stay with you for the time you are told. It is important to have someone help care for you until you are awake and alert. Do not smoke. Keep all follow-up visits as told by your health care provider. This is important. Contact a health  care provider if: You are still sleepy or having trouble with balance after 24 hours. You feel light-headed. You keep feeling nauseous or you keep vomiting. You develop a rash. You have a fever. You have redness or swelling around the IV site. Get help right away if: You have trouble breathing. You have new-onset confusion at home. Summary After the procedure, it is common to feel sleepy, have impaired judgment, or feel nauseous if you eat too soon. Rest after you get home. Know the things you should not do after the procedure. Follow the diet recommended by your health care provider and drink enough fluid to keep your urine pale yellow. Get help right away if you have trouble breathing or new-onset confusion at home. This information is not intended to replace advice given to you by your health care provider. Make sure you discuss any questions you have with your health care provider. Document  Revised: 05/11/2019 Document Reviewed: 12/07/2018 Elsevier Patient Education  2021 [employer].  "

## 2024-01-13 NOTE — Procedures (Signed)
 Interventional Radiology Procedure:   Indications: Urinary retention and needs exchange of suprapubic tube  Procedure: SPT exchange  Findings: 14 Fr tube was up sized to 16 Fr Council catheter   Complications: None     EBL: Minimal  Plan:  Discharge in 1 hour.     Evaleen Sant R. Philip, MD  Pager: [phone]

## 2024-01-27 NOTE — Progress Notes (Signed)
 "   Virtual Visit via Telephone Note   Because of Joel Kline co-morbid illnesses, he is at least at moderate risk for complications without adequate follow up.  This format is felt to be most appropriate for this patient at this time.  Due to technical limitations with video connection (technology), today's appointment will be conducted as an audio only telehealth visit, and Joel Kline verbally agreed to proceed in this manner.   All issues noted in this document were discussed and addressed.  No physical exam could be performed with this format. Evaluation Performed:  Preoperative cardiovascular risk assessment _____________   Date:  01/30/2024  Patient ID:  Joel Kline, DOB 11/04/1955, MRN [id number] Patient Location:  Home Provider location:   Office Primary Care Provider:  Benjamine Lauraine DASEN, NP Primary Cardiologist:  None Chief Complaint / Patient Profile  69 y.o. 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 Melita and presents today for telephonic preoperative cardiovascular risk assessment. History of Present Illness   Joel Kline is a 69 y.o. male who presents via audio/video conferencing for a telehealth visit today.  Pt was last seen in cardiology clinic on 09/16/23 by Dr. Liborio.  At that time Joel Kline was doing well.  The patient is now pending procedure as outlined above. Since his last visit, he has remained stable from a cardiac standpoint. Today he denies chest pain, shortness of breath, lower extremity edema, fatigue, palpitations, melena, hematuria, hemoptysis, diaphoresis, weakness, presyncope, syncope, orthopnea, and PND. 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  Past Medical History    Past Medical History:  Diagnosis  Date   AKI (acute kidney injury)    Benign essential hypertension    Cellulitis of left foot 09/29/2021   Elevated PSA    Family history of colon cancer 09/02/2023   Family history of ovarian cancer 09/02/2023   Gastric mass 09/02/2023   History of kidney stones    Hx of lithotripsy 1999   Pre-diabetes    Preop cardiovascular exam 07/12/2023   Rotator cuff arthropathy of right shoulder 08/05/2023   Urinary retention 09/02/2023   Past Surgical History:  Procedure Laterality Date   ESOPHAGOGASTRODUODENOSCOPY N/A 11/25/2023   Procedure: EGD (ESOPHAGOGASTRODUODENOSCOPY);  Surgeon: Rollin Dover, MD;  Location: [facility];  Service: Gastroenterology;  Laterality: N/A;   EUS N/A 11/25/2023   Procedure: ULTRASOUND, UPPER GI TRACT, ENDOSCOPIC;  Surgeon: Rollin Dover, MD;  Location: [facility];  Service: Gastroenterology;  Laterality: N/A;   FINE NEEDLE ASPIRATION  11/25/2023   Procedure: FINE NEEDLE ASPIRATION;  Surgeon: Rollin Dover, MD;  Location: [facility];  Service: Gastroenterology;;   FOOT SURGERY  2023   GASTRECTOMY, PARTIAL, ROBOT-ASSISTED N/A 10/11/2023   Procedure: ROBOT-ASSISTED PARTIAL HEPATECTOMY;  Surgeon: Aron Shoulders, MD;  Location: [facility];  Service: General;  Laterality: N/A;  ROBOTIC PARTIAL GASTRECTOMY   INCISION AND DRAINAGE Left 09/30/2021   Procedure: INCISION AND DRAINAGE OF LEFT FOOT ABSCESS;  Surgeon: Malvin Marsa FALCON, DPM;  Location: [facility];  Service: Podiatry;  Laterality: Left;   IR CYSTOSTOMY TUBE CHANGE COMPLICATED W IMG  01/13/2024   IR PATIENT EVAL TECH 0-60 MINS  12/07/2023   SAVORY DILATION N/A 11/25/2023   Procedure: EGD, WITH DILATION USING SAVARY-GILLIARD DILATOR OVER GUIDEWIRE;  Surgeon: Rollin Dover,  MD;  Location: [facility];  Service: Gastroenterology;  Laterality: N/A;    Allergies  Allergies[1]  Home Medications    Prior to Admission medications  Medication Sig Start Date End Date Taking? Authorizing Provider  amLODipine  (NORVASC) 2.5 MG tablet Take 2.5 mg by mouth daily. 09/15/23   [provider]  baclofen (LIORESAL) 10 MG tablet Take 10 mg by mouth daily as needed for muscle spasms. 10/22/22   [provider]  calcium carbonate (TUMS EX) 750 MG chewable tablet Chew 1 tablet by mouth as needed for heartburn.    [provider]  MAGNESIUM PO Take 1 tablet by mouth daily.    [provider]  oxyCODONE  (OXY IR/ROXICODONE ) 5 MG immediate release tablet Take 1 tablet (5 mg total) by mouth every 6 (six) hours as needed for severe pain (pain score 7-10). 10/11/23   Aron Shoulders, MD  tamsulosin (FLOMAX) 0.4 MG CAPS capsule Take 0.4 mg by mouth daily. 07/15/23   [provider]  traMADol (ULTRAM) 50 MG tablet Take 50 mg by mouth 2 (two) times daily as needed for moderate pain (pain score 4-6). 09/15/23   [provider]    Physical Exam  Vital Signs:  Joel Kline does not have vital signs available for review today. Given telephonic nature of communication, physical exam is limited. AAOx3. NAD. Normal affect.  Speech and respirations are unlabored. Accessory Clinical Findings  None Assessment & Plan    1.  Preoperative Cardiovascular Risk Assessment: Joel Kline perioperative risk of a major cardiac event is 0.4% according to the Revised Cardiac Risk Index (RCRI).  Therefore, he is at low risk for perioperative complications.   His functional capacity is excellent at 8.27 METs according to the Duke Activity Status Index (DASI). Recommendations: According to ACC/AHA guidelines, no further cardiovascular testing needed.  The patient may proceed to surgery at acceptable risk.   Antiplatelet and/or Anticoagulation Recommendations: None requested.   The patient was advised that if he develops new symptoms prior to surgery to contact our office to arrange for a follow-up visit, and he verbalized understanding.  A copy of this note will be routed to requesting  surgeon.  Time:   Today, I have spent 10 minutes with the patient with telehealth technology discussing medical history, symptoms, and management plan.    Joel Bennison D Jefferson Fullam, NP  01/30/2024, 10:58 AM      [1] No Active Allergies  "

## 2024-01-30 ENCOUNTER — Ambulatory Visit: Attending: Cardiology

## 2024-01-30 DIAGNOSIS — Z01810 Encounter for preprocedural cardiovascular examination: Secondary | ICD-10-CM | POA: Diagnosis not present

## 2024-02-15 ENCOUNTER — Ambulatory Visit
Admission: RE | Admit: 2024-02-15 | Discharge: 2024-02-15 | Disposition: A | Source: Ambulatory Visit | Attending: General Surgery | Admitting: General Surgery

## 2024-02-15 DIAGNOSIS — Q453 Other congenital malformations of pancreas and pancreatic duct: Secondary | ICD-10-CM

## 2024-02-15 DIAGNOSIS — Z800 Family history of malignant neoplasm of digestive organs: Secondary | ICD-10-CM

## 2024-02-15 DIAGNOSIS — Z8041 Family history of malignant neoplasm of ovary: Secondary | ICD-10-CM

## 2024-02-15 DIAGNOSIS — Z9889 Other specified postprocedural states: Secondary | ICD-10-CM

## 2024-02-15 MED ORDER — GADOPICLENOL 0.5 MMOL/ML IV SOLN
8.0000 mL | Freq: Once | INTRAVENOUS | Status: AC | PRN
  Administered 2024-02-15: 8 mL via INTRAVENOUS
